# Patient Record
Sex: Female | Born: 1965 | Hispanic: Yes | Marital: Single | Smoking: Never smoker
Health system: Southern US, Community
[De-identification: ages and names within clinical notes are randomized; demographics above are authoritative.]

## PROBLEM LIST (undated history)

## (undated) DIAGNOSIS — E119 Type 2 diabetes mellitus without complications: Secondary | ICD-10-CM

## (undated) DIAGNOSIS — R51 Headache: Secondary | ICD-10-CM

## (undated) DIAGNOSIS — R519 Headache, unspecified: Secondary | ICD-10-CM

## (undated) HISTORY — PX: CARPAL TUNNEL RELEASE: SHX101

---

## 2018-01-17 ENCOUNTER — Other Ambulatory Visit: Payer: Self-pay

## 2018-01-17 ENCOUNTER — Encounter (HOSPITAL_BASED_OUTPATIENT_CLINIC_OR_DEPARTMENT_OTHER): Payer: Self-pay | Admitting: Emergency Medicine

## 2018-01-17 ENCOUNTER — Emergency Department (HOSPITAL_BASED_OUTPATIENT_CLINIC_OR_DEPARTMENT_OTHER)
Admission: EM | Admit: 2018-01-17 | Discharge: 2018-01-18 | Disposition: A | Discharge status: Home / Self Care | Payer: BLUE CROSS/BLUE SHIELD | Attending: Emergency Medicine | Admitting: Emergency Medicine

## 2018-01-17 DIAGNOSIS — R42 Dizziness and giddiness: Secondary | ICD-10-CM | POA: Insufficient documentation

## 2018-01-17 DIAGNOSIS — R51 Headache: Secondary | ICD-10-CM | POA: Diagnosis present

## 2018-01-17 DIAGNOSIS — R358 Other polyuria: Secondary | ICD-10-CM | POA: Diagnosis not present

## 2018-01-17 DIAGNOSIS — E119 Type 2 diabetes mellitus without complications: Secondary | ICD-10-CM | POA: Diagnosis not present

## 2018-01-17 DIAGNOSIS — R631 Polydipsia: Secondary | ICD-10-CM | POA: Insufficient documentation

## 2018-01-17 DIAGNOSIS — R110 Nausea: Secondary | ICD-10-CM | POA: Insufficient documentation

## 2018-01-17 DIAGNOSIS — H53149 Visual discomfort, unspecified: Secondary | ICD-10-CM | POA: Insufficient documentation

## 2018-01-17 DIAGNOSIS — R739 Hyperglycemia, unspecified: Secondary | ICD-10-CM | POA: Insufficient documentation

## 2018-01-17 DIAGNOSIS — G43009 Migraine without aura, not intractable, without status migrainosus: Secondary | ICD-10-CM | POA: Insufficient documentation

## 2018-01-17 HISTORY — DX: Type 2 diabetes mellitus without complications: E11.9

## 2018-01-17 HISTORY — DX: Headache: R51

## 2018-01-17 HISTORY — DX: Headache, unspecified: R51.9

## 2018-01-17 LAB — URINALYSIS, ROUTINE W REFLEX MICROSCOPIC
BILIRUBIN URINE: NEGATIVE
Glucose, UA: >=500 mg/dL — AB
HGB URINE DIPSTICK: NEGATIVE
Ketones, ur: NEGATIVE mg/dL
Leukocytes, UA: NEGATIVE
Nitrite: NEGATIVE
PH: 7 (ref 5.0–8.0)
Protein, ur: NEGATIVE mg/dL
SPECIFIC GRAVITY, URINE: 1.01 (ref 1.005–1.030)

## 2018-01-17 LAB — CBC
HEMATOCRIT: 44.6 % (ref 36.0–46.0)
HEMOGLOBIN: 15.4 g/dL — AB (ref 12.0–15.0)
MCH: 30 pg (ref 26.0–34.0)
MCHC: 34.5 g/dL (ref 30.0–36.0)
MCV: 86.8 fL (ref 78.0–100.0)
Platelets: 353 10*3/uL (ref 150–400)
RBC: 5.14 MIL/uL — AB (ref 3.87–5.11)
RDW: 12.5 % (ref 11.5–15.5)
WBC: 12 10*3/uL — ABNORMAL HIGH (ref 4.0–10.5)

## 2018-01-17 LAB — URINALYSIS, MICROSCOPIC (REFLEX)

## 2018-01-17 LAB — BASIC METABOLIC PANEL
ANION GAP: 11 (ref 5–15)
BUN: 9 mg/dL (ref 6–20)
CHLORIDE: 98 mmol/L — AB (ref 101–111)
CO2: 23 mmol/L (ref 22–32)
Calcium: 8.9 mg/dL (ref 8.9–10.3)
Creatinine, Ser: 0.59 mg/dL (ref 0.44–1.00)
GFR calc non Af Amer: >60 mL/min (ref >60)
GLUCOSE: 456 mg/dL — AB (ref 65–99)
POTASSIUM: 3.7 mmol/L (ref 3.5–5.1)
Sodium: 132 mmol/L — ABNORMAL LOW (ref 135–145)

## 2018-01-17 LAB — CBG MONITORING, ED
GLUCOSE-CAPILLARY: 390 mg/dL — AB (ref 65–99)
Glucose-Capillary: 445 mg/dL — ABNORMAL HIGH (ref 65–99)

## 2018-01-17 LAB — PREGNANCY, URINE: Preg Test, Ur: NEGATIVE

## 2018-01-17 MED ORDER — KETOROLAC TROMETHAMINE 30 MG/ML IJ SOLN
30.0000 mg | Freq: Once | INTRAMUSCULAR | Status: AC
  Administered 2018-01-17: 30 mg via INTRAVENOUS
  Filled 2018-01-17: qty 1

## 2018-01-17 MED ORDER — SODIUM CHLORIDE 0.9 % IV BOLUS (SEPSIS)
1000.0000 mL | Freq: Once | INTRAVENOUS | Status: AC
  Administered 2018-01-18: 1000 mL via INTRAVENOUS

## 2018-01-17 MED ORDER — SODIUM CHLORIDE 0.9 % IV BOLUS (SEPSIS)
1000.0000 mL | Freq: Once | INTRAVENOUS | Status: AC
  Administered 2018-01-17: 1000 mL via INTRAVENOUS

## 2018-01-17 MED ORDER — METOCLOPRAMIDE HCL 5 MG/ML IJ SOLN
10.0000 mg | Freq: Once | INTRAMUSCULAR | Status: AC
  Administered 2018-01-17: 10 mg via INTRAVENOUS
  Filled 2018-01-17: qty 2

## 2018-01-17 NOTE — ED Provider Notes (Signed)
[facility] EMERGENCY DEPARTMENT Provider Note   CSN: [id number] Arrival date & time: 01/17/18  1931     History   Chief Complaint Chief Complaint  Patient presents with  . Headache    HPI Debbie Barrett is a 52 y.o. female with past medical history of diabetes, has not taking insulin for several months due to finances, migraines, who presents to ED for evaluation of 3-hour history of gradually worsening headache.  She reports associated nausea, photophobia.  Reports this feels similar to her prior migraines.  She had not had a migraine in about 3 months.  She took ibuprofen no relief in her symptoms.  She also reports intermittent dizziness as well as polyuria and polydipsia.  Denies any fevers, neck pain, numbness in arms or legs, head injuries or falls, vision changes, URI symptoms.  HPI  Past Medical History:  Diagnosis Date  . Diabetes mellitus without complication (HCC)   . Headache     There are no active problems to display for this patient.   History reviewed. No pertinent surgical history.  OB History    No data available       Home Medications    Prior to Admission medications   Not on File    Family History History reviewed. No pertinent family history.  Social History Social History   Tobacco Use  . Smoking status: Never Smoker  . Smokeless tobacco: Never Used  Substance Use Topics  . Alcohol use: No    Frequency: Never  . Drug use: No     Allergies   Ketorolac; Penicillins; Sulfa antibiotics; and Tramadol   Review of Systems Review of Systems  Constitutional: Negative for appetite change, chills and fever.  HENT: Negative for ear pain, rhinorrhea, sneezing and sore throat.   Eyes: Positive for photophobia. Negative for visual disturbance.  Respiratory: Negative for cough, chest tightness, shortness of breath and wheezing.   Cardiovascular: Negative for chest pain and palpitations.  Gastrointestinal: Positive for nausea.  Negative for abdominal pain, blood in stool, constipation, diarrhea and vomiting.  Endocrine: Positive for polydipsia and polyuria.  Genitourinary: Negative for dysuria, hematuria and urgency.  Musculoskeletal: Negative for myalgias.  Skin: Negative for rash.  Neurological: Positive for dizziness and headaches. Negative for weakness and light-headedness.     Physical Exam Updated Vital Signs BP (!) 122/92 (BP Location: Right Arm)   Pulse (!) 115   Temp 98.4 F (36.9 C) (Oral)   Resp 18   Ht 5\' 4"  (1.626 m)   Wt 99.8 kg (220 lb)   SpO2 98%   BMI 37.76 kg/m   Physical Exam  Constitutional: She is oriented to person, place, and time. She appears well-developed and well-nourished. No distress.  HENT:  Head: Normocephalic and atraumatic.  Nose: Nose normal.  Eyes: Conjunctivae and EOM are normal. Left eye exhibits no discharge. No scleral icterus.  Neck: Normal range of motion. Neck supple.  Cardiovascular: Normal rate, regular rhythm, normal heart sounds and intact distal pulses. Exam reveals no gallop and no friction rub.  No murmur heard. Pulmonary/Chest: Effort normal and breath sounds normal. No respiratory distress.  Abdominal: Soft. Bowel sounds are normal. She exhibits no distension. There is no tenderness. There is no guarding.  Musculoskeletal: Normal range of motion. She exhibits no edema.  Neurological: She is alert and oriented to person, place, and time. No cranial nerve deficit or sensory deficit. She exhibits normal muscle tone. Coordination normal.  Pupils reactive. No facial asymmetry noted. Cranial  nerves appear grossly intact. Sensation intact to light touch on face, BUE and BLE. Strength 5/5 in BUE and BLE.   Skin: Skin is warm and dry. No rash noted.  Psychiatric: She has a normal mood and affect.  Nursing note and vitals reviewed.    ED Treatments / Results  Labs (all labs ordered are listed, but only abnormal results are displayed) Labs Reviewed  BASIC  METABOLIC PANEL - Abnormal; Notable for the following components:      Result Value   Sodium 132 (*)    Chloride 98 (*)    Glucose, Bld 456 (*)    All other components within normal limits  CBC - Abnormal; Notable for the following components:   WBC 12.0 (*)    RBC 5.14 (*)    Hemoglobin 15.4 (*)    All other components within normal limits  URINALYSIS, ROUTINE W REFLEX MICROSCOPIC - Abnormal; Notable for the following components:   Glucose, UA >=500 (*)    All other components within normal limits  URINALYSIS, MICROSCOPIC (REFLEX) - Abnormal; Notable for the following components:   Bacteria, UA RARE (*)    Squamous Epithelial / LPF 0-5 (*)    All other components within normal limits  CBG MONITORING, ED - Abnormal; Notable for the following components:   Glucose-Capillary 445 (*)    All other components within normal limits  CBG MONITORING, ED - Abnormal; Notable for the following components:   Glucose-Capillary 390 (*)    All other components within normal limits  PREGNANCY, URINE    EKG  EKG Interpretation None       Radiology No results found.  Procedures Procedures (including critical care time)  Medications Ordered in ED Medications  sodium chloride 0.9 % bolus 1,000 mL (0 mLs Intravenous Stopped 01/17/18 2227)  metoCLOPramide (REGLAN) injection 10 mg (10 mg Intravenous Given 01/17/18 2138)  ketorolac (TORADOL) 30 MG/ML injection 30 mg (30 mg Intravenous Given 01/17/18 2229)  sodium chloride 0.9 % bolus 1,000 mL (1,000 mLs Intravenous New Bag/Given 01/17/18 2229)  sodium chloride 0.9 % bolus 1,000 mL (1,000 mLs Intravenous New Bag/Given 01/18/18 0014)     Initial Impression / Assessment and Plan / ED Course  I have reviewed the triage vital signs and the nursing notes.  Pertinent labs & imaging results that were available during my care of the patient were reviewed by me and considered in my medical decision making (see chart for details).  Clinical Course as  of Jan 19 20  Fri Jan 17, 2018  2356 Left a message for case management at [facility] ED to contact patient to establish with PCP at [facility] and any medication assistance.  [HK]  2357 Reports improvement in headache, dizziness. Ambulatory with normal gait.  [HK]    Clinical Course User Index [HK] Dietrich Pates, PA-C    Patient presents to ED for evaluation of 3-hour history of gradually worsening headache with associated nausea and photophobia.  Also reports dizziness.  She is diabetic and has not taking her diabetes medications or insulin in several months.  She does report polyuria and polydipsia.  On physical exam she is overall well-appearing.  She does appear uncomfortable but she has no deficits on her neurological examination.  She does not appear dehydrated.  Initial CBG was 145.  Urinalysis with no evidence of UTI or ketonuria.  BMP slightly low sodium at 132 but otherwise no signs of acidosis.  CBC with mild leukocytosis at 12 which could be reactive.  Hemoglobin hematocrit normal.  CBG improved to 90 with fluids given here.  She reports complete resolution of her symptoms with Toradol, Reglan given here in the ED and is been able to tolerate p.o. intake without difficulty.   There are no headache characteristics that are lateralizing or concerning for increased ICP, infectious or vascular cause of his symptoms.   She is ambulatory with normal gait.  I did contact case management to set up an appointment with a new PCP at the wellness center and for medication assistance.  Heart rate improving with fluids. Will recheck HR after 3rd liter of fluids is completed.  Portions of this note were generated with Scientist, clinical (histocompatibility and immunogenetics)Dragon dictation software. Dictation errors may occur despite best attempts at proofreading.   Final Clinical Impressions(s) / ED Diagnoses   Final diagnoses:  Migraine without aura and without status migrainosus, not intractable  Hyperglycemia    ED Discharge Orders    None        Dietrich PatesKhatri, Rielynn Trulson, PA-C 01/18/18 [id number]    Gwyneth SproutPlunkett, Whitney, MD 01/19/18 2019

## 2018-01-17 NOTE — ED Triage Notes (Addendum)
Patient states that she has had a headache since she ate dinner. Reports that she is dizzy with the pain and nausea  - patient states that she is not taking her insulin because she has not been able to afford it. Patient has not been checking her sugars as well

## 2018-01-18 LAB — CBG MONITORING, ED
GLUCOSE-CAPILLARY: 335 mg/dL — AB (ref 65–99)
Glucose-Capillary: 294 mg/dL — ABNORMAL HIGH (ref 65–99)

## 2018-01-18 MED ORDER — INSULIN GLARGINE 100 UNIT/ML ~~LOC~~ SOLN: 40.0000 [IU] | Freq: Every day | SUBCUTANEOUS | 6 refills | Status: AC

## 2018-01-18 MED ORDER — INSULIN GLARGINE 100 UNIT/ML ~~LOC~~ SOLN
SUBCUTANEOUS | Status: AC
  Filled 2018-01-18: qty 1

## 2018-01-18 MED ORDER — INSULIN GLARGINE 100 UNIT/ML ~~LOC~~ SOLN
40.0000 [IU] | Freq: Once | SUBCUTANEOUS | Status: AC
  Administered 2018-01-18: 40 [IU] via SUBCUTANEOUS

## 2018-01-18 MED ORDER — INSULIN ASPART 100 UNIT/ML ~~LOC~~ SOLN: SUBCUTANEOUS | 6 refills | Status: AC

## 2018-01-18 MED ORDER — INSULIN REGULAR HUMAN 100 UNIT/ML IJ SOLN
10.0000 [IU] | Freq: Once | INTRAMUSCULAR | Status: AC
  Administered 2018-01-18: 10 [IU] via SUBCUTANEOUS
  Filled 2018-01-18: qty 1

## 2018-01-18 NOTE — ED Provider Notes (Signed)
1:00 AM  [facility], [place].  Patient is a 52 year old insulin-dependent diabetic who presented to the emergency department today with headache.  Also found to be tachycardic and hyperglycemic.  Unable to afford her medications.  Headache treated with migraine cocktail and patient is feeling better.  Blood glucose slowly improving with IV hydration as is her heart rate.  Plan is to check EKG, repeat CBG.  Labs and urine showed no sign of DKA.  Patient denies chest pain or shortness of breath.  Headache has improved.  No recent fevers, vomiting or diarrhea.  States she is on NovoLog 3 times a day and Lantus 40 units at night.  Reports she has not had her insulin in months because she cannot afford it.  2:15 AM  Pt's HR is 97.  2:45 AM  Pt's blood sugar now under 300.  Case management has been contacted to help patient with her prescriptions.  I will refill her NovoLog and Lantus.  Recommend alternating Tylenol and Motrin for headache.  Given list of primary care physicians.  Recommended increase fluid intake.  At this time, I do not feel there is any life-threatening condition present. I have reviewed and discussed all results (EKG, imaging, lab, urine as appropriate) and exam findings with patient/family. I have reviewed nursing notes and appropriate previous records.  I feel the patient is safe to be discharged home without further emergent workup and can continue workup as an outpatient as needed. Discussed usual and customary return precautions. Patient/family verbalize understanding and are comfortable with this plan.  Outpatient follow-up has been provided if needed. All questions have been answered.    EKG Interpretation  Date/Time:  Saturday January 18 2018 01:06:53 EDT Ventricular Rate:  106 PR Interval:    QRS Duration: 116 QT Interval:  350 QTC Calculation: 465 R Axis:   90 Text Interpretation:  Sinus tachycardia Right atrial enlargement Nonspecific intraventricular  conduction delay Probable anteroseptal infarct, old No old tracing to compare Confirmed by Kenichi Cassada [id number]) on 01/18/2018 1:10:10 AM         Lamiah Marmol, Layla MawKristen N, DO 01/18/18 [id number]

## 2018-01-18 NOTE — ED Notes (Signed)
Pt verbalizes understanding of d/c instructions and denies any further need at this time. 

## 2018-01-18 NOTE — Discharge Instructions (Addendum)
Please read attached information regarding your condition. Follow-up at the wellness center listed below for further evaluation. The case management will contact you in the next few days to help with medication assistance and arranging PCP follow-up. Continue your home medications as previously prescribed. Return to ED for worsening symptoms, head injuries or falls, numbness in arms or legs, increased vomiting.   To find a primary care or specialty doctor please call [phone] or [phone] to access "[facility] Find a Doctor Service."  You may also go on the [facility] website at [email or web address]  There are also multiple [facility], [facility], [facility] and [facility] practices throughout the [place] that are frequently accepting new patients. You may find a clinic that is close to your home and contact them.  [facility] -  [address] [place] [id number] [phone]   [facility] -  [address] [place] [id number] [phone]   [facility] [phone]  [place] [phone]

## 2018-01-18 NOTE — ED Notes (Signed)
Pt feeling better, ready to go home.

## 2018-12-20 ENCOUNTER — Other Ambulatory Visit: Payer: Self-pay

## 2018-12-20 ENCOUNTER — Emergency Department (HOSPITAL_BASED_OUTPATIENT_CLINIC_OR_DEPARTMENT_OTHER)
Admission: EM | Admit: 2018-12-20 | Discharge: 2018-12-20 | Disposition: A | Discharge status: Home / Self Care | Payer: Medicaid Other | Attending: Emergency Medicine | Admitting: Emergency Medicine

## 2018-12-20 ENCOUNTER — Encounter (HOSPITAL_BASED_OUTPATIENT_CLINIC_OR_DEPARTMENT_OTHER): Payer: Self-pay | Admitting: *Deleted

## 2018-12-20 DIAGNOSIS — K0889 Other specified disorders of teeth and supporting structures: Secondary | ICD-10-CM | POA: Diagnosis not present

## 2018-12-20 DIAGNOSIS — Z794 Long term (current) use of insulin: Secondary | ICD-10-CM | POA: Diagnosis not present

## 2018-12-20 DIAGNOSIS — E119 Type 2 diabetes mellitus without complications: Secondary | ICD-10-CM | POA: Insufficient documentation

## 2018-12-20 DIAGNOSIS — R6884 Jaw pain: Secondary | ICD-10-CM | POA: Diagnosis present

## 2018-12-20 MED ORDER — CLINDAMYCIN HCL 150 MG PO CAPS: 300.0000 mg | ORAL_CAPSULE | Freq: Four times a day (QID) | ORAL | 0 refills | Status: AC

## 2018-12-20 MED ORDER — CLINDAMYCIN HCL 150 MG PO CAPS
300.0000 mg | ORAL_CAPSULE | Freq: Once | ORAL | Status: AC
  Administered 2018-12-20: 300 mg via ORAL
  Filled 2018-12-20: qty 2

## 2018-12-20 NOTE — ED Provider Notes (Signed)
[facility] EMERGENCY DEPARTMENT Provider Note   CSN: [id number] Arrival date & time: 12/20/18  1813     History   Chief Complaint Chief Complaint  Patient presents with  . Dental Pain    HPI Debbie Barrett is a 53 y.o. female.  Patient with history of diabetes presents the emergency department with worsening pain in her left lower jaw for the past 2 days.  She states that pain is severe and is no longer her to sleep.  She has some subjective facial swelling.  No neck pain, trouble breathing or trouble swallowing.  She has a tooth that is loose in the back of her mouth that is causing her pain.  She states that she is taking Tylenol without relief of symptoms.     Past Medical History:  Diagnosis Date  . Diabetes mellitus without complication (HCC)   . Headache     There are no active problems to display for this patient.   Past Surgical History:  Procedure Laterality Date  . CARPAL TUNNEL RELEASE    . CESAREAN SECTION       OB History   No obstetric history on file.      Home Medications    Prior to Admission medications   Medication Sig Start Date End Date Taking? Authorizing Provider  clindamycin (CLEOCIN) 150 MG capsule Take 2 capsules (300 mg total) by mouth every 6 (six) hours. 12/20/18   Renne Crigler, PA-C  insulin aspart (NOVOLOG) 100 UNIT/ML injection Sliding scale insulin 3 times a day with meals 01/18/18   Ward, Baxter Hire N, DO  insulin glargine (LANTUS) 100 UNIT/ML injection Inject 0.4 mLs (40 Units total) into the skin at bedtime. 01/18/18   Ward, Layla Maw, DO    Family History No family history on file.  Social History Social History   Tobacco Use  . Smoking status: Never Smoker  . Smokeless tobacco: Never Used  Substance Use Topics  . Alcohol use: Yes    Frequency: Never    Comment: occasional  . Drug use: No     Allergies   Ketorolac; Penicillins; Sulfa antibiotics; and Tramadol   Review of Systems Review of Systems   Constitutional: Negative for fever.  HENT: Positive for dental problem and facial swelling. Negative for ear pain, sore throat and trouble swallowing.   Respiratory: Negative for shortness of breath and stridor.   Musculoskeletal: Negative for neck pain.  Skin: Negative for color change.  Neurological: Negative for headaches.     Physical Exam Updated Vital Signs BP 127/87 (BP Location: Right Arm)   Pulse (!) 105   Temp 98.1 F (36.7 C) (Oral)   Resp 18   Ht 5\' 4"  (1.626 m)   Wt 102.1 kg   LMP 12/13/2018 (Approximate)   SpO2 100%   BMI 38.62 kg/m   Physical Exam Vitals signs and nursing note reviewed.  Constitutional:      Appearance: She is well-developed.  HENT:     Head: Normocephalic and atraumatic.     Jaw: No trismus.     Right Ear: Tympanic membrane, ear canal and external ear normal.     Left Ear: Tympanic membrane, ear canal and external ear normal.     Nose: Nose normal.     Mouth/Throat:     Dentition: Abnormal dentition. Dental caries present. No dental abscesses.     Pharynx: Uvula midline. No uvula swelling.     Tonsils: No tonsillar abscesses.     Comments: Advanced  periodontal disease, with multiple missing teeth and multiple caries.  Patient with a loose left mandibular molar.  Gingiva is extremely inflamed without signs of distinct abscess. Eyes:     Conjunctiva/sclera: Conjunctivae normal.  Neck:     Musculoskeletal: Normal range of motion and neck supple.     Comments: No neck swelling or Ludwig's angina Lymphadenopathy:     Cervical: No cervical adenopathy.  Skin:    General: Skin is warm and dry.  Neurological:     Mental Status: She is alert.      ED Treatments / Results  Labs (all labs ordered are listed, but only abnormal results are displayed) Labs Reviewed - No data to display  EKG None  Radiology No results found.  Procedures Procedures (including critical care time)  Medications Ordered in ED Medications  clindamycin  (CLEOCIN) capsule 300 mg (has no administration in time range)     Initial Impression / Assessment and Plan / ED Course  I have reviewed the triage vital signs and the nursing notes.  Pertinent labs & imaging results that were available during my care of the patient were reviewed by me and considered in my medical decision making (see chart for details).     7:02 PM Patient seen and examined. Medications ordered.   Vital signs reviewed and are as follows: BP 127/87 (BP Location: Right Arm)   Pulse (!) 105   Temp 98.1 F (36.7 C) (Oral)   Resp 18   Ht 5\' 4"  (1.626 m)   Wt 102.1 kg   LMP 12/13/2018 (Approximate)   SpO2 100%   BMI 38.62 kg/m   Encouraged continued use of Tylenol for pain.  Patient counseled to take prescribed medications as directed, return with worsening facial or neck swelling, and to follow-up with their dentist as soon as possible.    Final Clinical Impressions(s) / ED Diagnoses   Final diagnoses:  Pain, dental   Patient with toothache. No fever. Exam unconcerning for Ludwig's angina or other deep tissue infection in neck.   As there is gum swelling, erythema, will treat with antibiotic.  Dental referrals given.  Urged patient to follow-up with dentist.     ED Discharge Orders         Ordered    clindamycin (CLEOCIN) 150 MG capsule  Every 6 hours     12/20/18 1858           Renne Crigler, Cordelia Poche 12/20/18 1903    Pricilla Loveless, MD 12/22/18 [phone]

## 2018-12-20 NOTE — ED Triage Notes (Signed)
Pt reports she has "bad teeth" and is having lower left dental pain since yesterday. States she cannot afford a dentist currently

## 2018-12-20 NOTE — Discharge Instructions (Signed)
Please read and follow all provided instructions.  Your diagnoses today include:  1. Pain, dental     The exam and treatment you received today has been provided on an emergency basis only. This is not a substitute for complete medical or dental care.  Tests performed today include:  Vital signs. See below for your results today.   Medications prescribed:   Clindamycin - antibiotic  You have been prescribed an antibiotic medicine: take the entire course of medicine even if you are feeling better. Stopping early can cause the antibiotic not to work.  Take any prescribed medications only as directed.  Home care instructions:  Follow any educational materials contained in this packet.  Follow-up instructions: Please follow-up with your dentist for further evaluation of your symptoms.   Return instructions:   Please return to the Emergency Department if you experience worsening symptoms.  Please return if you develop a fever, you develop more swelling in your face or neck, you have trouble breathing or swallowing food.  Please return if you have any other emergent concerns.  Additional Information:  Your vital signs today were: BP 127/87 (BP Location: Right Arm)    Pulse (!) 105    Temp 98.1 F (36.7 C) (Oral)    Resp 18    Ht 5\' 4"  (1.626 m)    Wt 102.1 kg    LMP 12/13/2018 (Approximate)    SpO2 100%    BMI 38.62 kg/m  If your blood pressure (BP) was elevated above 135/85 this visit, please have this repeated by your doctor within one month. --------------

## 2019-06-26 ENCOUNTER — Emergency Department (HOSPITAL_BASED_OUTPATIENT_CLINIC_OR_DEPARTMENT_OTHER): Payer: Medicaid Other

## 2019-06-26 ENCOUNTER — Encounter (HOSPITAL_BASED_OUTPATIENT_CLINIC_OR_DEPARTMENT_OTHER): Payer: Self-pay | Admitting: *Deleted

## 2019-06-26 ENCOUNTER — Other Ambulatory Visit: Payer: Self-pay

## 2019-06-26 ENCOUNTER — Emergency Department (HOSPITAL_BASED_OUTPATIENT_CLINIC_OR_DEPARTMENT_OTHER)
Admission: EM | Admit: 2019-06-26 | Discharge: 2019-06-26 | Disposition: A | Discharge status: Home / Self Care | Payer: Medicaid Other | Attending: Emergency Medicine | Admitting: Emergency Medicine

## 2019-06-26 DIAGNOSIS — R000 Tachycardia, unspecified: Secondary | ICD-10-CM | POA: Insufficient documentation

## 2019-06-26 DIAGNOSIS — Z794 Long term (current) use of insulin: Secondary | ICD-10-CM | POA: Diagnosis not present

## 2019-06-26 DIAGNOSIS — E119 Type 2 diabetes mellitus without complications: Secondary | ICD-10-CM | POA: Diagnosis not present

## 2019-06-26 DIAGNOSIS — H4920 Sixth [abducent] nerve palsy, unspecified eye: Secondary | ICD-10-CM | POA: Diagnosis not present

## 2019-06-26 DIAGNOSIS — R51 Headache: Secondary | ICD-10-CM | POA: Diagnosis present

## 2019-06-26 LAB — COMPREHENSIVE METABOLIC PANEL
ALT: 16 U/L (ref 0–44)
AST: 14 U/L — ABNORMAL LOW (ref 15–41)
Albumin: 3.8 g/dL (ref 3.5–5.0)
Alkaline Phosphatase: 56 U/L (ref 38–126)
Anion gap: 9 (ref 5–15)
BUN: 13 mg/dL (ref 6–20)
CO2: 26 mmol/L (ref 22–32)
Calcium: 9.2 mg/dL (ref 8.9–10.3)
Chloride: 103 mmol/L (ref 98–111)
Creatinine, Ser: 0.65 mg/dL (ref 0.44–1.00)
GFR calc Af Amer: >60 mL/min (ref >60)
GFR calc non Af Amer: >60 mL/min (ref >60)
Glucose, Bld: 136 mg/dL — ABNORMAL HIGH (ref 70–99)
Potassium: 3.6 mmol/L (ref 3.5–5.1)
Sodium: 138 mmol/L (ref 135–145)
Total Bilirubin: 0.6 mg/dL (ref 0.3–1.2)
Total Protein: 7.5 g/dL (ref 6.5–8.1)

## 2019-06-26 LAB — CBC WITH DIFFERENTIAL/PLATELET
Abs Immature Granulocytes: 0.05 10*3/uL (ref 0.00–0.07)
Basophils Absolute: 0 10*3/uL (ref 0.0–0.1)
Basophils Relative: 0 %
Eosinophils Absolute: 0.2 10*3/uL (ref 0.0–0.5)
Eosinophils Relative: 2 %
HCT: 42.9 % (ref 36.0–46.0)
Hemoglobin: 14.2 g/dL (ref 12.0–15.0)
Immature Granulocytes: 1 %
Lymphocytes Relative: 34 %
Lymphs Abs: 3.4 10*3/uL (ref 0.7–4.0)
MCH: 30.1 pg (ref 26.0–34.0)
MCHC: 33.1 g/dL (ref 30.0–36.0)
MCV: 91.1 fL (ref 80.0–100.0)
Monocytes Absolute: 0.5 10*3/uL (ref 0.1–1.0)
Monocytes Relative: 5 %
Neutro Abs: 6 10*3/uL (ref 1.7–7.7)
Neutrophils Relative %: 58 %
Platelets: 373 10*3/uL (ref 150–400)
RBC: 4.71 MIL/uL (ref 3.87–5.11)
RDW: 11.8 % (ref 11.5–15.5)
WBC: 10.2 10*3/uL (ref 4.0–10.5)
nRBC: 0 % (ref 0.0–0.2)

## 2019-06-26 LAB — URINALYSIS, ROUTINE W REFLEX MICROSCOPIC
Bilirubin Urine: NEGATIVE
Glucose, UA: 250 mg/dL — AB
Hgb urine dipstick: NEGATIVE
Ketones, ur: NEGATIVE mg/dL
Nitrite: NEGATIVE
Protein, ur: NEGATIVE mg/dL
Specific Gravity, Urine: 1.025 (ref 1.005–1.030)
pH: 6 (ref 5.0–8.0)

## 2019-06-26 LAB — SEDIMENTATION RATE: Sed Rate: 42 mm/hr — ABNORMAL HIGH (ref 0–22)

## 2019-06-26 LAB — URINALYSIS, MICROSCOPIC (REFLEX): RBC / HPF: NONE SEEN RBC/hpf (ref 0–5)

## 2019-06-26 LAB — C-REACTIVE PROTEIN: CRP: <0.8 mg/dL (ref <1.0)

## 2019-06-26 IMAGING — CT CT ANGIOGRAPHY HEAD
1 of 17 series · 4 of 35 positions shown · IV contrast (APPLIED)
Comparison: Brain MRI [DATE]

CLINICAL DATA: L vision for 1 month

EXAM:
CT ANGIOGRAPHY HEAD AND NECK
CT venogram
TECHNIQUE: Multidetector CT imaging of the head and neck was performed using
the standard protocol during bolus administration of intravenous
contrast. Multiplanar CT image reconstructions and MIPs were
obtained to evaluate the vascular anatomy. Carotid stenosis
measurements (when applicable) are obtained utilizing NASCET
criteria, using the distal internal carotid diameter as the
denominator.
A CT venogram was requested and a delayed phase was acquired, but
was not needed given even better venous sided opacification on the
CTA portion of the study.
CONTRAST:  100mL OMNIPAQUE IOHEXOL 350 MG/ML SOLN

[Series 11: axial thin · axial · 0.58mm/px · z∈[-348,-113]mm · 4 of 395 slices shown]
[im 79/395  soft-tissue]
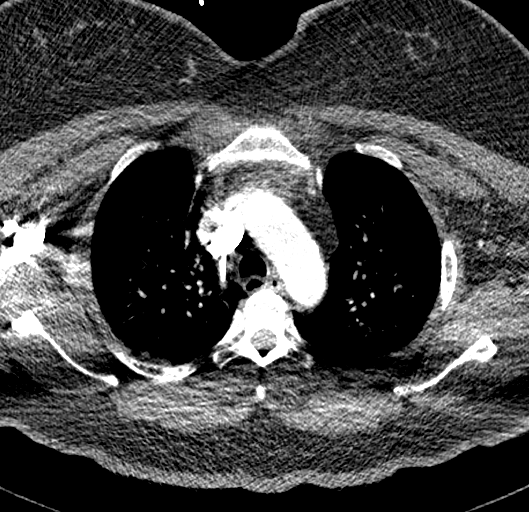
[im 158/395  bone]
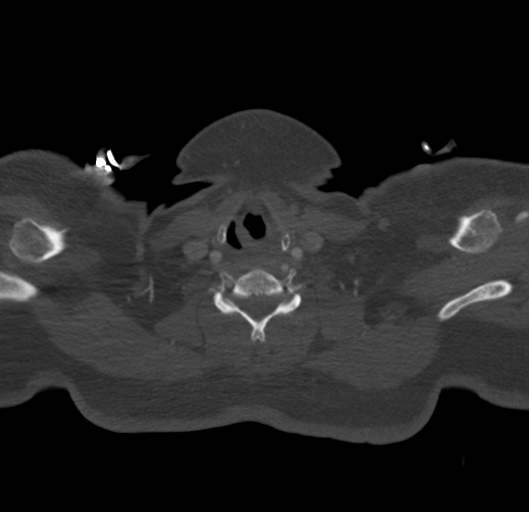
[im 237/395  soft-tissue]
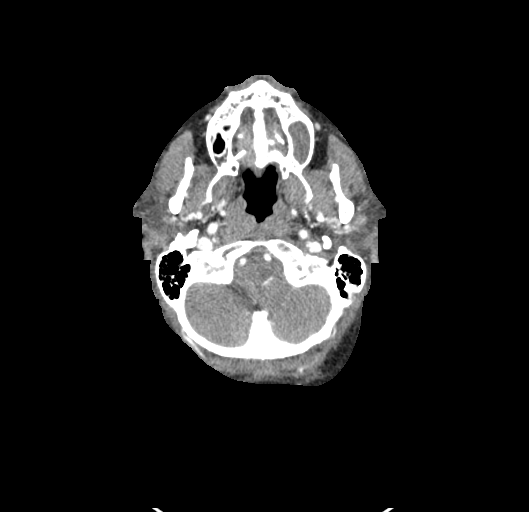
[im 316/395  bone]
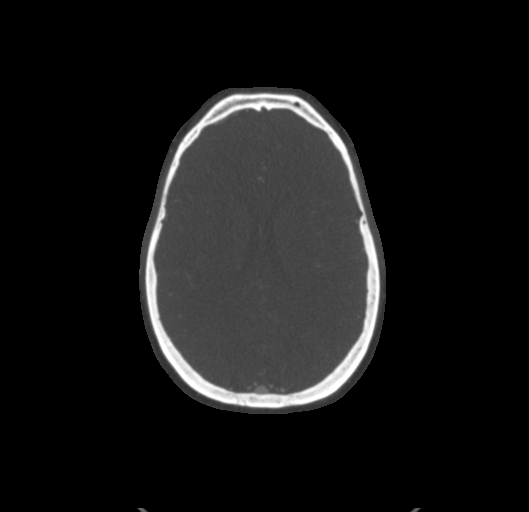

[4 of 35 positions shown; findings below may reference images not displayed]

FINDINGS: CT HEAD FINDINGS

Brain: No evidence of acute infarction, hemorrhage, hydrocephalus,
extra-axial collection or mass lesion/mass effect.

Vascular: Negative

Skull: Negative

Sinuses: Retention cyst in the left maxillary sinus

Orbits: Negative.  No mass or inflammation to explain symptoms.

Review of the MIP images confirms the above findings

CTA NECK FINDINGS

Aortic arch: Normal 4 vessel branching. No acute finding or
dilatation.

Right carotid system: Vessels are smooth and widely patent

Left carotid system: The vessels are smooth and widely patent

Vertebral arteries: Left vertebral artery arises from the arch. No
proximal subclavian stenosis. Vessels are smooth and widely patent
to the dura.

Skeleton: No acute or aggressive finding

Other neck: 6 mm right upper lobe pulmonary nodule.

Upper chest: Negative

Review of the MIP images confirms the above findings

CTA HEAD FINDINGS

Anterior circulation: Vessels are smooth and widely patent. No
atheromatous changes or stenosis. Negative for aneurysm

Posterior circulation: Vessels are smooth and widely patent. No
stenosis, aneurysm, or branch occlusion.

Venous sinuses: The dural venous sinuses are diffusely patent
without stenosis an arachnoid granulation is present along the right
transverse sinus. Paired deep veins are also patent.

Anatomic variants: None significant

Elevated right diaphragm on scanogram.

Delayed phase was acquired for CT venogram purposes. As above, these
are less helpful than the CTA images and are also negative for dural
venous sinus thrombosis. No evidence of abnormal parenchymal
enhancement.

Review of the MIP images confirms the above findings
IMPRESSION: 1. Negative CTA of the head and neck.
2. No dural venous sinus thrombosis.
3. 5 mm right upper lobe pulmonary nodule. No follow-up needed if
patient is low-risk. Non-contrast chest CT can be considered in 12
months if patient is high-risk. This recommendation follows the
consensus statement: Guidelines for Management of Incidental
Pulmonary Nodules Detected on CT Images: From the [HOSPITAL]

## 2019-06-26 MED ORDER — SODIUM CHLORIDE 0.9 % IV BOLUS
1000.0000 mL | Freq: Once | INTRAVENOUS | Status: AC
  Administered 2019-06-26: 1000 mL via INTRAVENOUS

## 2019-06-26 MED ORDER — PROCHLORPERAZINE EDISYLATE 10 MG/2ML IJ SOLN
10.0000 mg | Freq: Once | INTRAMUSCULAR | Status: AC
  Administered 2019-06-26: 10 mg via INTRAVENOUS
  Filled 2019-06-26: qty 2

## 2019-06-26 MED ORDER — DIPHENHYDRAMINE HCL 50 MG/ML IJ SOLN
25.0000 mg | Freq: Once | INTRAMUSCULAR | Status: AC
  Administered 2019-06-26: 25 mg via INTRAVENOUS
  Filled 2019-06-26: qty 1

## 2019-06-26 MED ORDER — IOHEXOL 350 MG/ML SOLN
100.0000 mL | Freq: Once | INTRAVENOUS | Status: AC | PRN
  Administered 2019-06-26: 100 mL via INTRAVENOUS

## 2019-06-26 NOTE — ED Notes (Signed)
CT will need to wait on results of BUN/Creat/egfr per [facility] radiology protocol d/t pt hx DM

## 2019-06-26 NOTE — ED Provider Notes (Signed)
Care was taken over from Dr. Billy Fischer.  Patient reports double vision and headaches for about 4 weeks.  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  Dr. Feliberto Harts had discussed this with neurology on-call who recommended doing a CT angiogram and a CT venogram.  The studies were normal other than she did have a 5 mm lung nodule.  I did advise the patient of this and advised her to let her PCP know about it although she does not seem to have any risk factors that would be concerning.  She currently is asymptomatic.  Her sed rate is mildly elevated but she does not have any palpable tenderness over the temporal artery or any loss of vision or blurry vision.  The double vision seems to be related to the 6th nerve palsy.  She has had some mild tachycardia which has improved in the ED.  Her heart rate is around 100 currently.  Her other labs are non-concerning.  She is currently completely asymptomatic other than the double vision.  She was discharged home in good condition.  She will follow-up on Monday with the neurologist that she already has an appointment with.  She was given an eye patch.  Return precautions were given.  Her urine did show some bacteria but she does not have symptoms that would be more concerning for UTI.  Her urine was sent for culture.   Malvin Johns, MD 06/26/19 [phone]

## 2019-06-26 NOTE — ED Notes (Signed)
ED Provider at bedside discussing CT results 

## 2019-06-26 NOTE — ED Provider Notes (Signed)
[facility] EMERGENCY DEPARTMENT Provider Note   CSN: [id number] Arrival date & time: 06/26/19  1305     History   Chief Complaint Chief Complaint  Patient presents with   Headache   Eye Problem    HPI Debbie Barrett is a 53 y.o. female.     HPI   Presents with worsening double vision, is present when looking straight ahead with both eyes, looking to the right is ok, looking to the left is double. Goes away when closing one eye. Diagonally up and down.  Headaches started around the same time, has always had migraine, headaches behind eyes and back of head.  Heating pads, has tramadol but doesn't like taking it.  Currently having 9/10 frontal headache  Nausea when headaches are severe. No vomiting. Low appetite.  No fevers.  Glucose has been ok for last 2 weeks until this AM  Sees ophthalmology Dr. Senaida Oresichardson retinal specialist  Progressive worsening of double vision and the headaches over the last month  Scheduled to see Neurologist on Monday Right eye sometimes feels droopy, feet bilateral numbness tingling on gabapentin Right eye has been more droopy in the last week. Comes and goes, not associated with anything, does not happen certain times of day  No numbness/weakness/facial droop/difficulty walking or talking  Started on a Monday about 4 weeks  Had MRI for headache 8/4   No medication changes  Past Medical History:  Diagnosis Date   Diabetes mellitus without complication (HCC)    Headache     There are no active problems to display for this patient.   Past Surgical History:  Procedure Laterality Date   CARPAL TUNNEL RELEASE     CESAREAN SECTION       OB History   No obstetric history on file.      Home Medications    Prior to Admission medications   Medication Sig Start Date End Date Taking? Authorizing Provider  clindamycin (CLEOCIN) 150 MG capsule Take 2 capsules (300 mg total) by mouth every 6 (six) hours. 12/20/18    Renne CriglerGeiple, Joshua, PA-C  insulin aspart (NOVOLOG) 100 UNIT/ML injection Sliding scale insulin 3 times a day with meals 01/18/18   Ward, Baxter HireKristen N, DO  insulin glargine (LANTUS) 100 UNIT/ML injection Inject 0.4 mLs (40 Units total) into the skin at bedtime. 01/18/18   Ward, Layla MawKristen N, DO    Family History No family history on file.  Social History Social History   Tobacco Use   Smoking status: Never Smoker   Smokeless tobacco: Never Used  Substance Use Topics   Alcohol use: Yes    Frequency: Never    Comment: occasional   Drug use: No     Allergies   Ketorolac, Penicillins, Sulfa antibiotics, and Tramadol   Review of Systems Review of Systems  Constitutional: Negative for fever.  HENT: Negative for sore throat.   Eyes: Positive for visual disturbance.  Respiratory: Negative for cough and shortness of breath.   Cardiovascular: Negative for chest pain.  Gastrointestinal: Negative for abdominal pain, nausea and vomiting.  Genitourinary: Negative for difficulty urinating.  Musculoskeletal: Negative for back pain and neck pain.  Skin: Negative for rash.  Neurological: Positive for headaches. Negative for dizziness, syncope, facial asymmetry, speech difficulty, weakness and numbness.     Physical Exam Updated Vital Signs BP 121/69 (BP Location: Left Arm)    Pulse (!) 101    Temp 98.6 F (37 C) (Oral)    Resp 16    Ht  5\' 4"  (1.626 m)    Wt 109.8 kg    SpO2 99%    BMI 41.54 kg/m   Physical Exam Vitals signs and nursing note reviewed.  Constitutional:      General: She is not in acute distress.    Appearance: She is well-developed. She is not diaphoretic.  HENT:     Head: Normocephalic and atraumatic.  Eyes:     General: No visual field deficit.    Conjunctiva/sclera: Conjunctivae normal.  Neck:     Musculoskeletal: Normal range of motion.  Cardiovascular:     Rate and Rhythm: Normal rate and regular rhythm.  Pulmonary:     Effort: Pulmonary effort is normal. No  respiratory distress.  Abdominal:     General: There is no distension.     Palpations: Abdomen is soft.     Tenderness: There is no abdominal tenderness. There is no guarding.  Musculoskeletal:        General: No tenderness.  Skin:    General: Skin is warm and dry.     Findings: No erythema or rash.  Neurological:     Mental Status: She is alert and oriented to person, place, and time.     GCS: GCS eye subscore is 4. GCS verbal subscore is 5. GCS motor subscore is 6.     Cranial Nerves: No cranial nerve deficit, dysarthria or facial asymmetry.     Sensory: Sensation is intact. No sensory deficit.     Motor: Motor function is intact. No weakness.     Coordination: Coordination is intact. Coordination normal. Finger-Nose-Finger Test and [facility] normal.     Gait: Gait is intact.     Comments: Left eye with CN 6 palsy Dysconjugate gaze with left eye turned medially Normal pupils      ED Treatments / Results  Labs (all labs ordered are listed, but only abnormal results are displayed) Labs Reviewed  COMPREHENSIVE METABOLIC PANEL - Abnormal; Notable for the following components:      Result Value   Glucose, Bld 136 (*)    AST 14 (*)    All other components within normal limits  URINALYSIS, ROUTINE W REFLEX MICROSCOPIC - Abnormal; Notable for the following components:   APPearance CLOUDY (*)    Glucose, UA 250 (*)    Leukocytes,Ua TRACE (*)    All other components within normal limits  SEDIMENTATION RATE - Abnormal; Notable for the following components:   Sed Rate 42 (*)    All other components within normal limits  URINALYSIS, MICROSCOPIC (REFLEX) - Abnormal; Notable for the following components:   Bacteria, UA MANY (*)    All other components within normal limits  URINE CULTURE  CBC WITH DIFFERENTIAL/PLATELET  C-REACTIVE PROTEIN    EKG EKG Interpretation  Date/Time:  Friday June 26 2019 15:19:47 EDT Ventricular Rate:  103 PR Interval:    QRS  Duration: 93 QT Interval:  354 QTC Calculation: 464 R Axis:   49 Text Interpretation:  Sinus tachycardia Probable anteroseptal infarct, old since last tracing no significant change Confirmed by Rolan BuccoBelfi, Melanie [phone]([id number]) on 06/26/2019 4:24:37 PM   Radiology Ct Angio Head W Or Wo Contrast  Result Date: 06/26/2019 CLINICAL DATA:  L vision for 1 month EXAM: CT ANGIOGRAPHY HEAD AND NECK CT venogram TECHNIQUE: Multidetector CT imaging of the head and neck was performed using the standard protocol during bolus administration of intravenous contrast. Multiplanar CT image reconstructions and MIPs were obtained to evaluate the vascular anatomy.  Carotid stenosis measurements (when applicable) are obtained utilizing NASCET criteria, using the distal internal carotid diameter as the denominator. A CT venogram was requested and a delayed phase was acquired, but was not needed given even better venous sided opacification on the CTA portion of the study. CONTRAST:  100mL OMNIPAQUE IOHEXOL 350 MG/ML SOLN COMPARISON:  Brain MRI 06/09/2019 FINDINGS: CT HEAD FINDINGS Brain: No evidence of acute infarction, hemorrhage, hydrocephalus, extra-axial collection or mass lesion/mass effect. Vascular: Negative Skull: Negative Sinuses: Retention cyst in the left maxillary sinus Orbits: Negative.  No mass or inflammation to explain symptoms. Review of the MIP images confirms the above findings CTA NECK FINDINGS Aortic arch: Normal 4 vessel branching. No acute finding or dilatation. Right carotid system: Vessels are smooth and widely patent Left carotid system: The vessels are smooth and widely patent Vertebral arteries: Left vertebral artery arises from the arch. No proximal subclavian stenosis. Vessels are smooth and widely patent to the dura. Skeleton: No acute or aggressive finding Other neck: 6 mm right upper lobe pulmonary nodule. Upper chest: Negative Review of the MIP images confirms the above findings CTA HEAD FINDINGS Anterior  circulation: Vessels are smooth and widely patent. No atheromatous changes or stenosis. Negative for aneurysm Posterior circulation: Vessels are smooth and widely patent. No stenosis, aneurysm, or branch occlusion. Venous sinuses: The dural venous sinuses are diffusely patent without stenosis an arachnoid granulation is present along the right transverse sinus. Paired deep veins are also patent. Anatomic variants: None significant Elevated right diaphragm on scanogram. Delayed phase was acquired for CT venogram purposes. As above, these are less helpful than the CTA images and are also negative for dural venous sinus thrombosis. No evidence of abnormal parenchymal enhancement. Review of the MIP images confirms the above findings IMPRESSION: 1. Negative CTA of the head and neck. 2. No dural venous sinus thrombosis. 3. 5 mm right upper lobe pulmonary nodule. No follow-up needed if patient is low-risk. Non-contrast chest CT can be considered in 12 months if patient is high-risk. This recommendation follows the consensus statement: Guidelines for Management of Incidental Pulmonary Nodules Detected on CT Images: From the Fleischner Society 2017; Radiology 2017; 284:228-243. Electronically Signed   By: Marnee SpringJonathon  Watts M.D.   On: 06/26/2019 17:19   Ct Angio Neck W And/or Wo Contrast  Result Date: 06/26/2019 CLINICAL DATA:  L vision for 1 month EXAM: CT ANGIOGRAPHY HEAD AND NECK CT venogram TECHNIQUE: Multidetector CT imaging of the head and neck was performed using the standard protocol during bolus administration of intravenous contrast. Multiplanar CT image reconstructions and MIPs were obtained to evaluate the vascular anatomy. Carotid stenosis measurements (when applicable) are obtained utilizing NASCET criteria, using the distal internal carotid diameter as the denominator. A CT venogram was requested and a delayed phase was acquired, but was not needed given even better venous sided opacification on the CTA  portion of the study. CONTRAST:  100mL OMNIPAQUE IOHEXOL 350 MG/ML SOLN COMPARISON:  Brain MRI 06/09/2019 FINDINGS: CT HEAD FINDINGS Brain: No evidence of acute infarction, hemorrhage, hydrocephalus, extra-axial collection or mass lesion/mass effect. Vascular: Negative Skull: Negative Sinuses: Retention cyst in the left maxillary sinus Orbits: Negative.  No mass or inflammation to explain symptoms. Review of the MIP images confirms the above findings CTA NECK FINDINGS Aortic arch: Normal 4 vessel branching. No acute finding or dilatation. Right carotid system: Vessels are smooth and widely patent Left carotid system: The vessels are smooth and widely patent Vertebral arteries: Left vertebral artery arises from the arch.  No proximal subclavian stenosis. Vessels are smooth and widely patent to the dura. Skeleton: No acute or aggressive finding Other neck: 6 mm right upper lobe pulmonary nodule. Upper chest: Negative Review of the MIP images confirms the above findings CTA HEAD FINDINGS Anterior circulation: Vessels are smooth and widely patent. No atheromatous changes or stenosis. Negative for aneurysm Posterior circulation: Vessels are smooth and widely patent. No stenosis, aneurysm, or branch occlusion. Venous sinuses: The dural venous sinuses are diffusely patent without stenosis an arachnoid granulation is present along the right transverse sinus. Paired deep veins are also patent. Anatomic variants: None significant Elevated right diaphragm on scanogram. Delayed phase was acquired for CT venogram purposes. As above, these are less helpful than the CTA images and are also negative for dural venous sinus thrombosis. No evidence of abnormal parenchymal enhancement. Review of the MIP images confirms the above findings IMPRESSION: 1. Negative CTA of the head and neck. 2. No dural venous sinus thrombosis. 3. 5 mm right upper lobe pulmonary nodule. No follow-up needed if patient is low-risk. Non-contrast chest CT can be  considered in 12 months if patient is high-risk. This recommendation follows the consensus statement: Guidelines for Management of Incidental Pulmonary Nodules Detected on CT Images: From the Fleischner Society 2017; Radiology 2017; 284:228-243. Electronically Signed   By: Marnee Spring M.D.   On: 06/26/2019 17:19   Ct Venogram Head  Result Date: 06/26/2019 CLINICAL DATA:  L vision for 1 month EXAM: CT ANGIOGRAPHY HEAD AND NECK CT venogram TECHNIQUE: Multidetector CT imaging of the head and neck was performed using the standard protocol during bolus administration of intravenous contrast. Multiplanar CT image reconstructions and MIPs were obtained to evaluate the vascular anatomy. Carotid stenosis measurements (when applicable) are obtained utilizing NASCET criteria, using the distal internal carotid diameter as the denominator. A CT venogram was requested and a delayed phase was acquired, but was not needed given even better venous sided opacification on the CTA portion of the study. CONTRAST:  OMNIPAQUE IOHEXOL 350 MG/ML SOLN COMPARISON:  Brain MRI 06/09/2019 FINDINGS: CT HEAD FINDINGS Brain: No evidence of acute infarction, hemorrhage, hydrocephalus, extra-axial collection or mass lesion/mass effect. Vascular: Negative Skull: Negative Sinuses: Retention cyst in the left maxillary sinus Orbits: Negative.  No mass or inflammation to explain symptoms. Review of the MIP images confirms the above findings CTA NECK FINDINGS Aortic arch: Normal 4 vessel branching. No acute finding or dilatation. Right carotid system: Vessels are smooth and widely patent Left carotid system: The vessels are smooth and widely patent Vertebral arteries: Left vertebral artery arises from the arch. No proximal subclavian stenosis. Vessels are smooth and widely patent to the dura. Skeleton: No acute or aggressive finding Other neck: 6 mm right upper lobe pulmonary nodule. Upper chest: Negative Review of the MIP images confirms the  above findings CTA HEAD FINDINGS Anterior circulation: Vessels are smooth and widely patent. No atheromatous changes or stenosis. Negative for aneurysm Posterior circulation: Vessels are smooth and widely patent. No stenosis, aneurysm, or branch occlusion. Venous sinuses: The dural venous sinuses are diffusely patent without stenosis an arachnoid granulation is present along the right transverse sinus. Paired deep veins are also patent. Anatomic variants: None significant Elevated right diaphragm on scanogram. Delayed phase was acquired for CT venogram purposes. As above, these are less helpful than the CTA images and are also negative for dural venous sinus thrombosis. No evidence of abnormal parenchymal enhancement. Review of the MIP images confirms the above findings IMPRESSION: 1. Negative CTA  of the head and neck. 2. No dural venous sinus thrombosis. 3. 5 mm right upper lobe pulmonary nodule. No follow-up needed if patient is low-risk. Non-contrast chest CT can be considered in 12 months if patient is high-risk. This recommendation follows the consensus statement: Guidelines for Management of Incidental Pulmonary Nodules Detected on CT Images: From the Fleischner Society 2017; Radiology 2017; 284:228-243. Electronically Signed   By: Monte Fantasia M.D.   On: 06/26/2019 17:19    Procedures Procedures (including critical care time)  Medications Ordered in ED Medications  sodium chloride 0.9 % bolus 1,000 mL (0 mLs Intravenous Stopped 06/26/19 1631)  prochlorperazine (COMPAZINE) injection 10 mg (10 mg Intravenous Given 06/26/19 1513)  diphenhydrAMINE (BENADRYL) injection 25 mg (25 mg Intravenous Given 06/26/19 1513)  iohexol (OMNIPAQUE) 350 MG/ML injection 100 mL (100 mLs Intravenous Contrast Given 06/26/19 1627)     Initial Impression / Assessment and Plan / ED Course  I have reviewed the triage vital signs and the nursing notes.  Pertinent labs & imaging results that were available during my care  of the patient were reviewed by me and considered in my medical decision making (see chart for details).        53yo female with history of DM, migraines, presents with 4 weeks of progressive headache and diplopia.  Exam shows 6th nerve palsy.  Had MRI showing no acute findings after symptoms developed.  Labs ordered.  DDx includes sinus venous thrombosis, isolated 6th nerve palsy, less likely benign intracranial hypertension, temporal arteritis.  Discussed with Dr. Lorraine Lax who recommends CTA/CT venogram.  Labs and CT pending at this time.  If no acute findings, pt may follow up with Neurology and Ophthalmology, use eye patch to help with symptom relief.      Final Clinical Impressions(s) / ED Diagnoses   Final diagnoses:  6th nerve palsy, unspecified laterality    ED Discharge Orders    None       Gareth Morgan, MD 06/26/19 2237

## 2019-06-26 NOTE — ED Triage Notes (Addendum)
Diabetic. 4 weeks of double vision. Months of headaches. Pt appears anxious and is very easily agitated with triage process.

## 2019-06-26 NOTE — Discharge Instructions (Addendum)
Follow-up with the neurologist as directed.  Return here as needed for any worsening symptoms

## 2019-06-28 LAB — URINE CULTURE: Culture: >=100000 — AB

## 2019-06-29 ENCOUNTER — Telehealth: Payer: Self-pay | Admitting: *Deleted

## 2019-06-29 NOTE — Telephone Encounter (Signed)
Post ED Visit - Positive Culture Follow-up  Culture report reviewed by antimicrobial stewardship pharmacist: [place] Team []  Elenor Quinones, Pharm.D. []  Heide Guile, Pharm.D., BCPS AQ-ID []  Parks Neptune, Pharm.D., BCPS []  Alycia Rossetti, Pharm.D., BCPS []  [place], [place].D., BCPS, AAHIVP []  Legrand Como, Pharm.D., BCPS, AAHIVP []  Salome Arnt, PharmD, BCPS []  Johnnette Gourd, PharmD, BCPS []  Hughes Better, PharmD, BCPS []  Leeroy Cha, PharmD []  Laqueta Linden, PharmD, BCPS []  Albertina Parr, PharmD  [place] Team []  Leodis Sias, PharmD []  Lindell Spar, PharmD []  Royetta Asal, PharmD []  Graylin Shiver, Rph []  Rema Fendt) Glennon Mac, PharmD []  Arlyn Dunning, PharmD []  Netta Cedars, PharmD []  Dia Sitter, PharmD []  Leone Haven, PharmD []  Gretta Arab, PharmD []  Theodis Shove, PharmD []  Peggyann Juba, PharmD []  Reuel Boom, PharmD   Positive urine culture, reviewed by Lenn Sink, PA-C No urinary symptoms and no further patient follow-up is required at this time.  Rika, [facility] 06/29/2019, 10:46 AM

## 2021-07-28 ENCOUNTER — Emergency Department (HOSPITAL_BASED_OUTPATIENT_CLINIC_OR_DEPARTMENT_OTHER)
Admission: EM | Admit: 2021-07-28 | Discharge: 2021-07-28 | Disposition: A | Discharge status: Home / Self Care | Payer: Medicaid Other | Attending: Emergency Medicine | Admitting: Emergency Medicine

## 2021-07-28 ENCOUNTER — Emergency Department (HOSPITAL_BASED_OUTPATIENT_CLINIC_OR_DEPARTMENT_OTHER): Payer: Medicaid Other

## 2021-07-28 ENCOUNTER — Other Ambulatory Visit: Payer: Self-pay

## 2021-07-28 ENCOUNTER — Emergency Department (HOSPITAL_COMMUNITY): Payer: Medicaid Other

## 2021-07-28 DIAGNOSIS — H4932 Total (external) ophthalmoplegia, left eye: Secondary | ICD-10-CM | POA: Insufficient documentation

## 2021-07-28 DIAGNOSIS — M79605 Pain in left leg: Secondary | ICD-10-CM | POA: Insufficient documentation

## 2021-07-28 DIAGNOSIS — Z794 Long term (current) use of insulin: Secondary | ICD-10-CM | POA: Insufficient documentation

## 2021-07-28 DIAGNOSIS — E119 Type 2 diabetes mellitus without complications: Secondary | ICD-10-CM | POA: Diagnosis not present

## 2021-07-28 DIAGNOSIS — Z20822 Contact with and (suspected) exposure to covid-19: Secondary | ICD-10-CM | POA: Insufficient documentation

## 2021-07-28 DIAGNOSIS — Z79899 Other long term (current) drug therapy: Secondary | ICD-10-CM | POA: Insufficient documentation

## 2021-07-28 DIAGNOSIS — H499 Unspecified paralytic strabismus: Secondary | ICD-10-CM

## 2021-07-28 DIAGNOSIS — G529 Cranial nerve disorder, unspecified: Secondary | ICD-10-CM | POA: Diagnosis not present

## 2021-07-28 DIAGNOSIS — H5712 Ocular pain, left eye: Secondary | ICD-10-CM | POA: Diagnosis present

## 2021-07-28 LAB — RAPID URINE DRUG SCREEN, HOSP PERFORMED
Amphetamines: NOT DETECTED
Barbiturates: NOT DETECTED
Benzodiazepines: NOT DETECTED
Cocaine: NOT DETECTED
Opiates: NOT DETECTED
Tetrahydrocannabinol: NOT DETECTED

## 2021-07-28 LAB — COMPREHENSIVE METABOLIC PANEL
ALT: 17 U/L (ref 0–44)
AST: 14 U/L — ABNORMAL LOW (ref 15–41)
Albumin: 4.1 g/dL (ref 3.5–5.0)
Alkaline Phosphatase: 74 U/L (ref 38–126)
Anion gap: 11 (ref 5–15)
BUN: 17 mg/dL (ref 6–20)
CO2: 23 mmol/L (ref 22–32)
Calcium: 9.2 mg/dL (ref 8.9–10.3)
Chloride: 101 mmol/L (ref 98–111)
Creatinine, Ser: 0.79 mg/dL (ref 0.44–1.00)
GFR, Estimated: >60 mL/min (ref >60)
Glucose, Bld: 188 mg/dL — ABNORMAL HIGH (ref 70–99)
Potassium: 4 mmol/L (ref 3.5–5.1)
Sodium: 135 mmol/L (ref 135–145)
Total Bilirubin: 0.6 mg/dL (ref 0.3–1.2)
Total Protein: 8.2 g/dL — ABNORMAL HIGH (ref 6.5–8.1)

## 2021-07-28 LAB — URINALYSIS, ROUTINE W REFLEX MICROSCOPIC
Bilirubin Urine: NEGATIVE
Glucose, UA: >=500 mg/dL — AB
Hgb urine dipstick: NEGATIVE
Ketones, ur: 80 mg/dL — AB
Leukocytes,Ua: NEGATIVE
Nitrite: NEGATIVE
Protein, ur: NEGATIVE mg/dL
Specific Gravity, Urine: 1.015 (ref 1.005–1.030)
pH: 5 (ref 5.0–8.0)

## 2021-07-28 LAB — URINALYSIS, MICROSCOPIC (REFLEX)

## 2021-07-28 LAB — DIFFERENTIAL
Abs Immature Granulocytes: 0.12 10*3/uL — ABNORMAL HIGH (ref 0.00–0.07)
Basophils Absolute: 0.1 10*3/uL (ref 0.0–0.1)
Basophils Relative: 0 %
Eosinophils Absolute: 0.1 10*3/uL (ref 0.0–0.5)
Eosinophils Relative: 1 %
Immature Granulocytes: 1 %
Lymphocytes Relative: 22 %
Lymphs Abs: 3 10*3/uL (ref 0.7–4.0)
Monocytes Absolute: 0.5 10*3/uL (ref 0.1–1.0)
Monocytes Relative: 4 %
Neutro Abs: 9.8 10*3/uL — ABNORMAL HIGH (ref 1.7–7.7)
Neutrophils Relative %: 72 %

## 2021-07-28 LAB — CBC
HCT: 49.6 % — ABNORMAL HIGH (ref 36.0–46.0)
Hemoglobin: 16.4 g/dL — ABNORMAL HIGH (ref 12.0–15.0)
MCH: 29.8 pg (ref 26.0–34.0)
MCHC: 33.1 g/dL (ref 30.0–36.0)
MCV: 90 fL (ref 80.0–100.0)
Platelets: 399 10*3/uL (ref 150–400)
RBC: 5.51 MIL/uL — ABNORMAL HIGH (ref 3.87–5.11)
RDW: 12.5 % (ref 11.5–15.5)
WBC: 13.6 10*3/uL — ABNORMAL HIGH (ref 4.0–10.5)
nRBC: 0 % (ref 0.0–0.2)

## 2021-07-28 LAB — HEMOGLOBIN A1C
Hgb A1c MFr Bld: 9.8 % — ABNORMAL HIGH (ref 4.8–5.6)
Mean Plasma Glucose: 234.56 mg/dL

## 2021-07-28 LAB — PROTIME-INR
INR: 1 (ref 0.8–1.2)
Prothrombin Time: 12.8 seconds (ref 11.4–15.2)

## 2021-07-28 LAB — TSH: TSH: 1.035 u[IU]/mL (ref 0.350–4.500)

## 2021-07-28 LAB — SEDIMENTATION RATE: Sed Rate: 10 mm/hr (ref 0–22)

## 2021-07-28 LAB — RESP PANEL BY RT-PCR (FLU A&B, COVID) ARPGX2
Influenza A by PCR: NEGATIVE
Influenza B by PCR: NEGATIVE
SARS Coronavirus 2 by RT PCR: NEGATIVE

## 2021-07-28 LAB — APTT: aPTT: 29 seconds (ref 24–36)

## 2021-07-28 LAB — CBG MONITORING, ED: Glucose-Capillary: 176 mg/dL — ABNORMAL HIGH (ref 70–99)

## 2021-07-28 IMAGING — MR MR HEAD WO/W CM
6 of 12 series · 26 of 48 positions shown · IV contrast (gadavist)
Comparison: CT head from the same day.

CLINICAL DATA: NEURO DEFICIT

EXAM:
MRI HEAD WITHOUT AND WITH CONTRAST
MRA HEAD WITHOUT CONTRAST
TECHNIQUE: Multiplanar, multiecho pulse sequences of the brain and surrounding
structures were obtained without and with intravenous contrast.
Angiographic images of the Circle of Willis were obtained using MRA
technique without intravenous contrast.
CONTRAST:  10mL GADAVIST GADOBUTROL 1 MMOL/ML IV SOLN

[Series 2: DWI · axial · 3.0mm · 0.94mm/px · z∈[-59,+93]mm · 9 of 104 slices shown (1 of 2)]
[im 1/104]
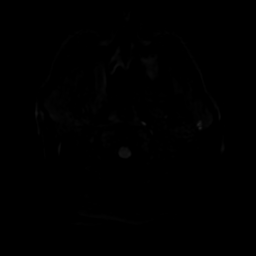
[im 13/104]
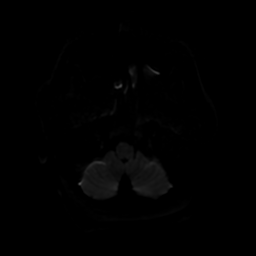
[im 26/104]
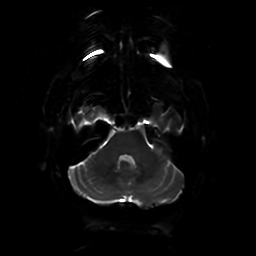
[im 39/104]
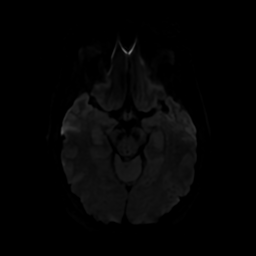
[im 52/104]
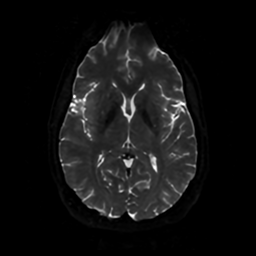
[im 65/104]
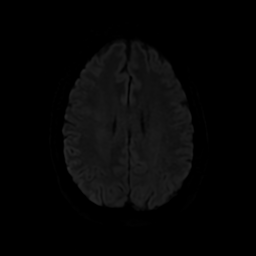
[im 78/104]
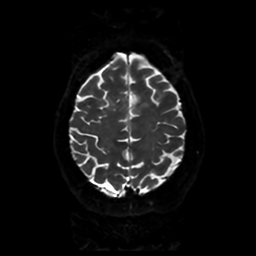
[im 91/104]
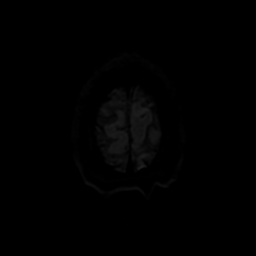
[im 104/104]
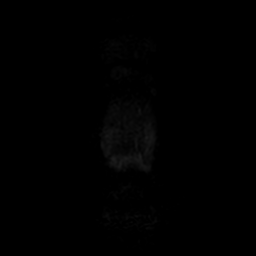

[Series 4: DWI · coronal · 4.0mm · 0.94mm/px · 6 of 76 slices shown (2 of 2)]
[im 1/76]
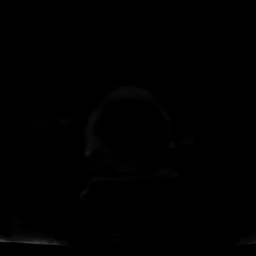
[im 16/76]
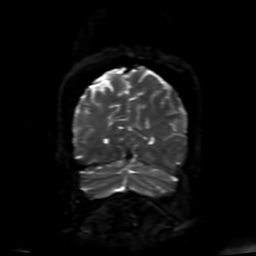
[im 31/76]
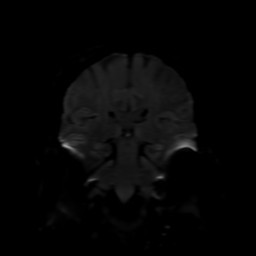
[im 46/76]
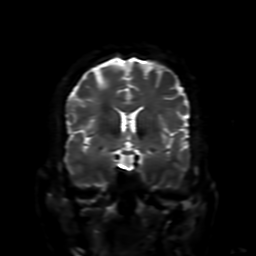
[im 61/76]
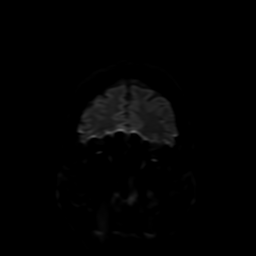
[im 76/76]
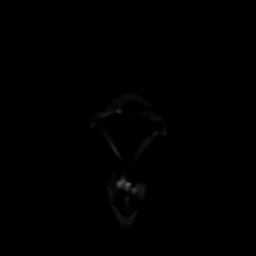

[Series 5: FLAIR · sagittal · 5.0mm · 0.23mm/px · 2 of 23 slices shown (1 of 2)]
[im 1/23]
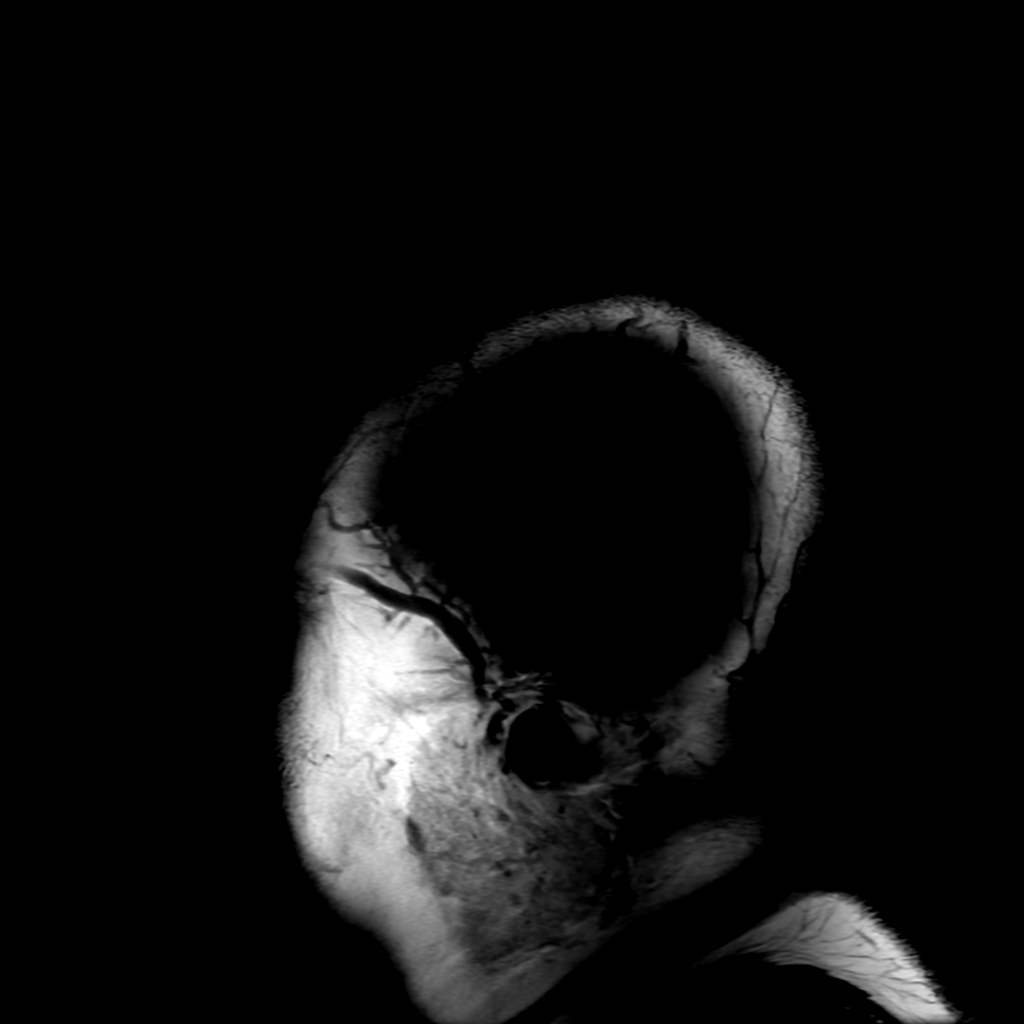
[im 23/23]
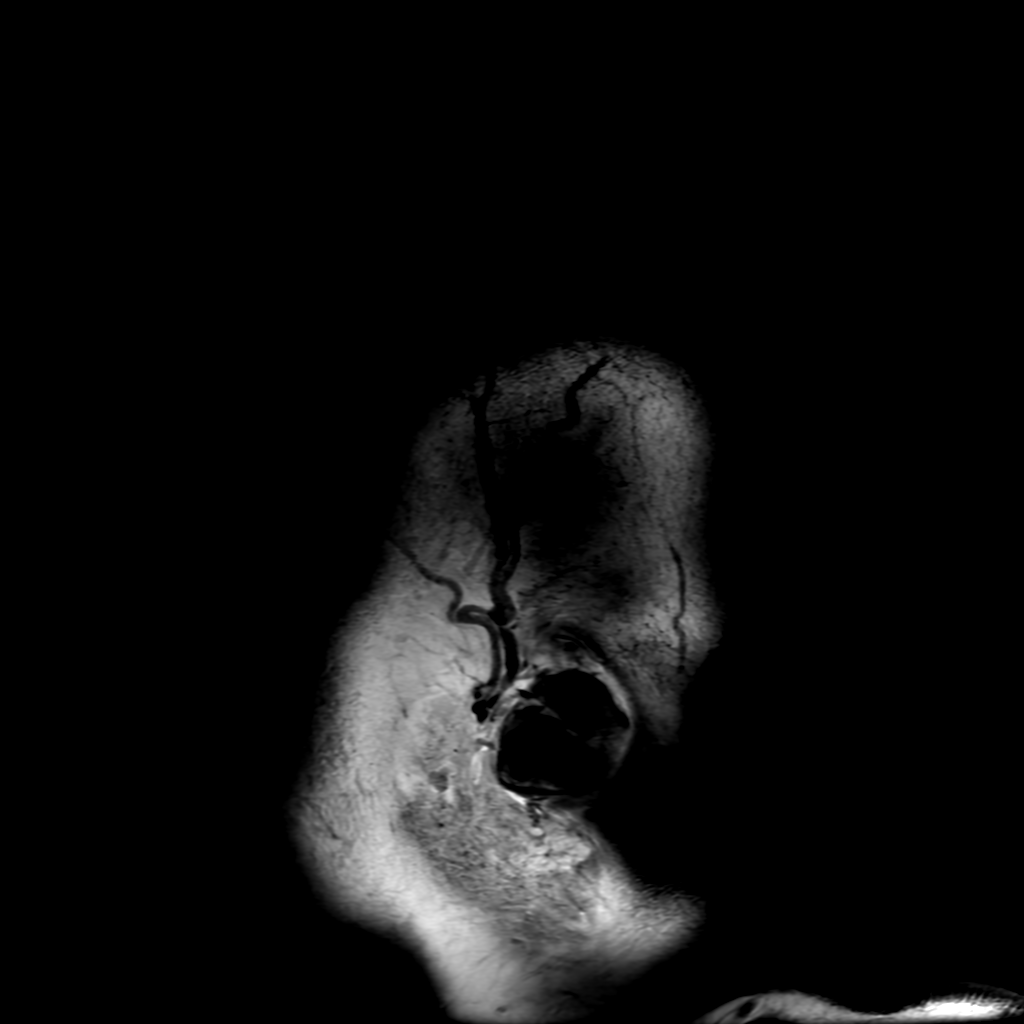

[Series 7: FLAIR · axial · 5.0mm · 0.47mm/px · z∈[-57,+92]mm · 2 of 26 slices shown (2 of 2)]
[im 1/26]
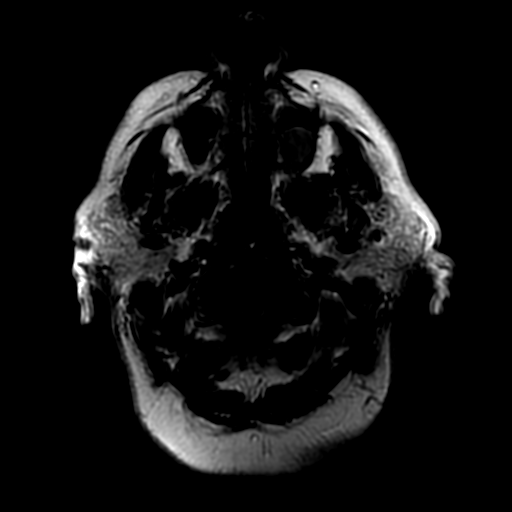
[im 26/26]
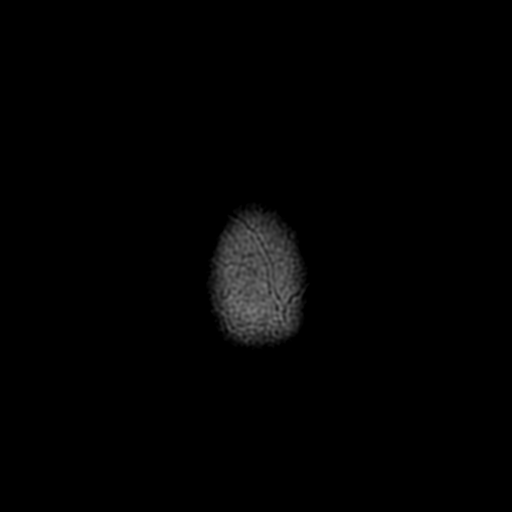

[Series 250: ADC · axial · 3.0mm · 0.94mm/px · z∈[-59,+93]mm · 4 of 52 slices shown (1 of 2)]
[im 1/52]
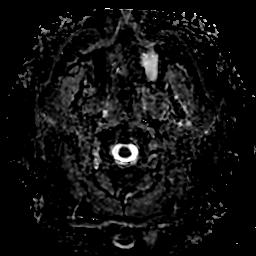
[im 18/52]
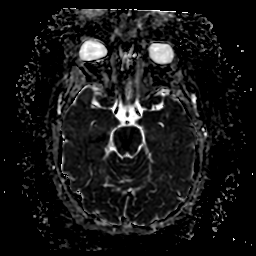
[im 35/52]
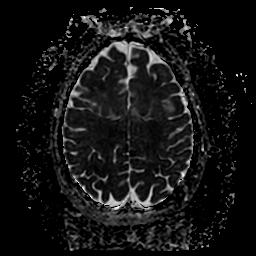
[im 52/52]
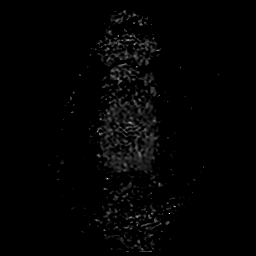

[Series 450: ADC · coronal · 4.0mm · 0.94mm/px · 3 of 38 slices shown (2 of 2)]
[im 1/38]
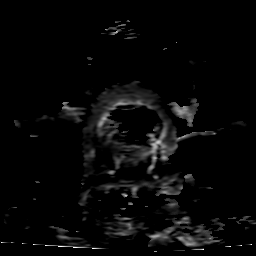
[im 19/38]
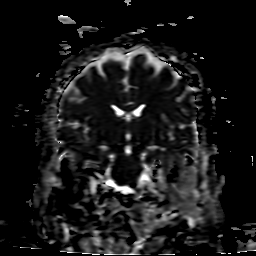
[im 38/38]
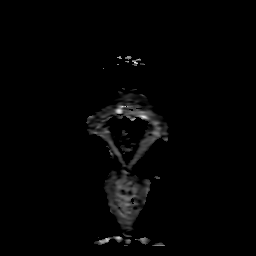

[26 of 48 positions shown; findings below may reference images not displayed]

FINDINGS: MRI HEAD FINDINGS

Brain: No acute infarction, hemorrhage, hydrocephalus, extra-axial
collection or mass lesion. No abnormal enhancement.

Vascular: See below.

Skull and upper cervical spine: Normal marrow signal.

Sinuses and orbits: Please see concurrent MRI orbits for evaluation.

Other: No mastoid effusions.

MRA HEAD FINDINGS

Anterior circulation: Bilateral intracranial ICAs, MCAs, and ACAs
are patent without proximal hemodynamically significant stenosis.
Trifurcate ACA. No aneurysm identified.

Posterior circulation: Bilateral intradural vertebral arteries,
basilar artery, and posterior cerebral arteries are patent without
proximal hemodynamically significant stenosis. No aneurysm
identified.
IMPRESSION: MRI head:

No evidence of acute intracranial abnormality.

MRA:

No large vessel occlusion or hemodynamically significant stenosis.

## 2021-07-28 IMAGING — MR MR ORBITS WO/W CM
4 of 8 series · 19 of 48 positions shown · IV contrast (cc gad)
Comparison: CT head from same day.

CLINICAL DATA: Diplopia

EXAM:
MRI OF THE ORBITS WITHOUT AND WITH CONTRAST
TECHNIQUE: Multiplanar, multi-echo pulse sequences of the orbits and
surrounding structures were acquired including fat saturation
techniques, before and after intravenous contrast administration.
CONTRAST:  10mL GADAVIST GADOBUTROL 1 MMOL/ML IV SOLN

[Series 10: T2 fat-sat · coronal · 4.0mm · 0.35mm/px · 6 of 22 slices shown (1 of 4)]
[im 1/22]
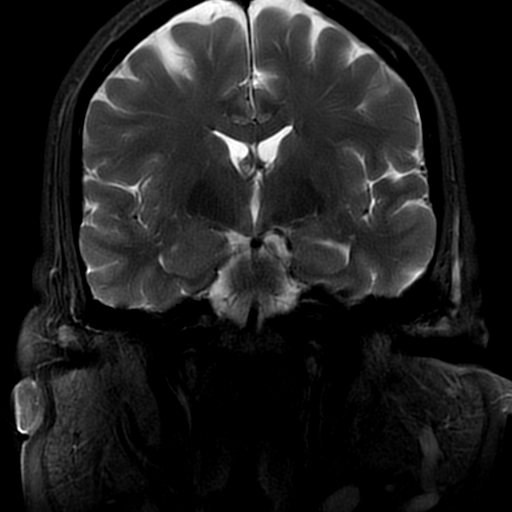
[im 5/22]
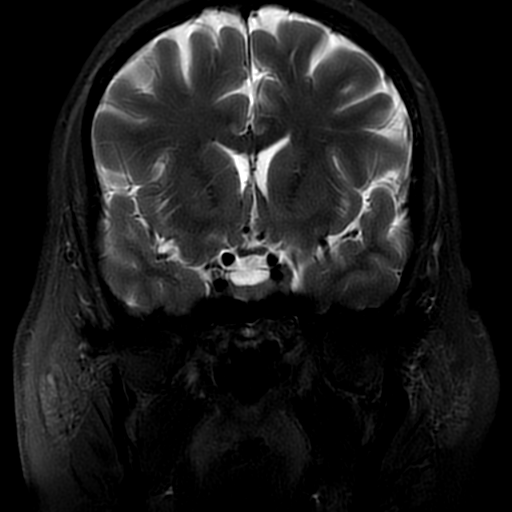
[im 9/22]
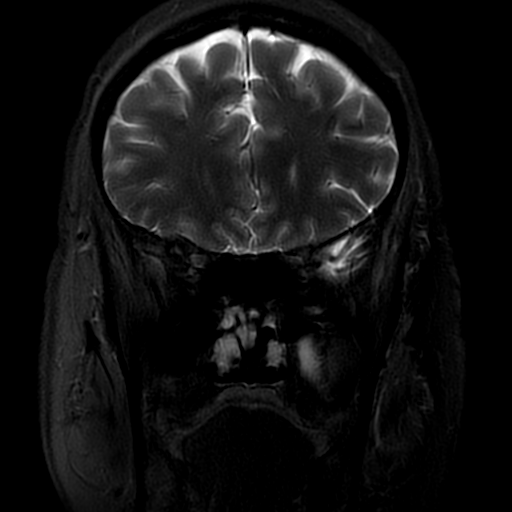
[im 13/22]
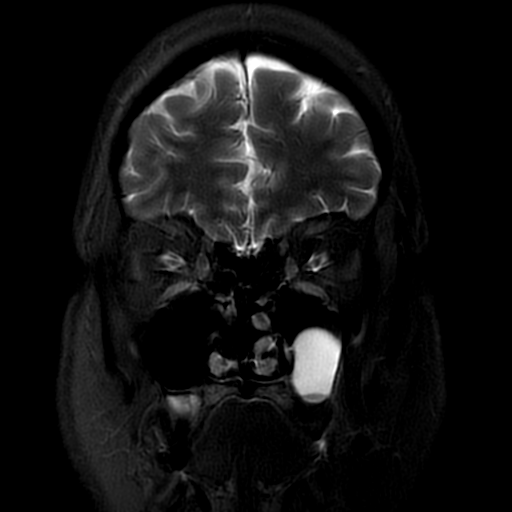
[im 17/22]
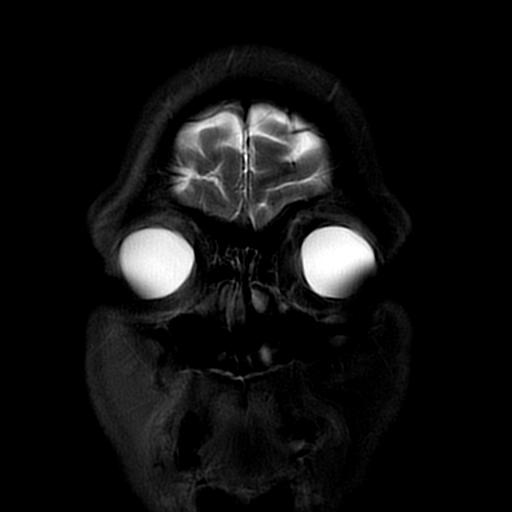
[im 22/22]
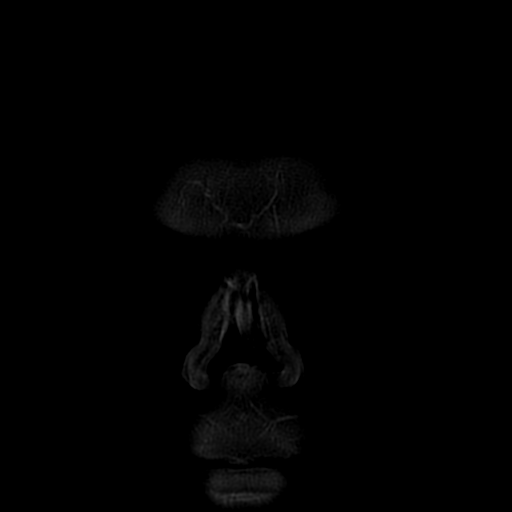

[Series 11: T2 fat-sat · axial · 3.0mm · 0.35mm/px · z∈[-45,+12]mm · 6 of 20 slices shown (2 of 4)]
[im 1/20]
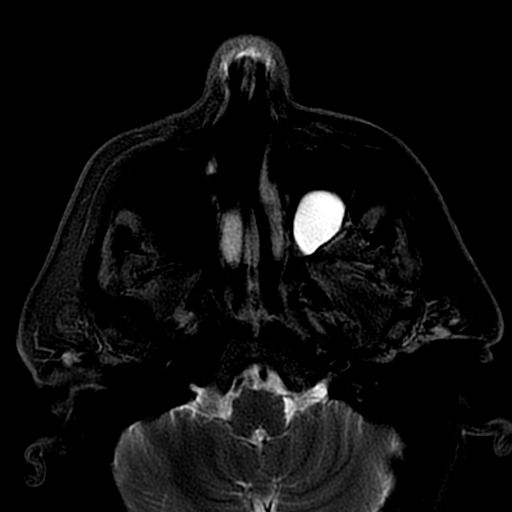
[im 4/20]
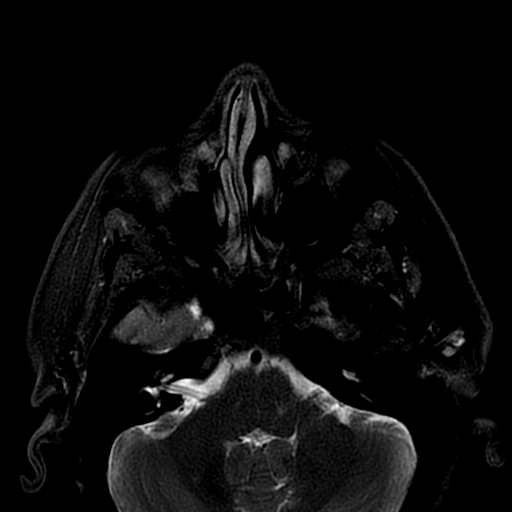
[im 8/20]
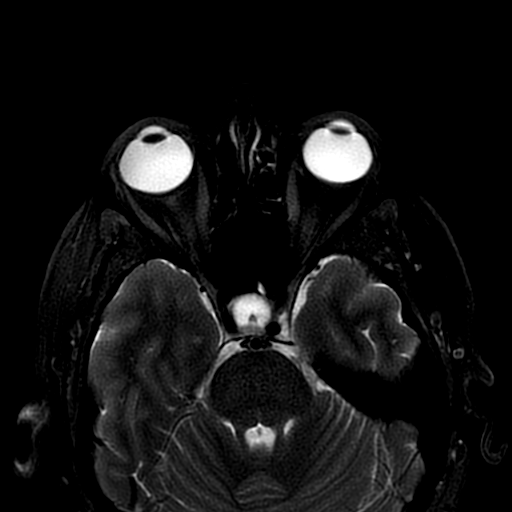
[im 12/20]
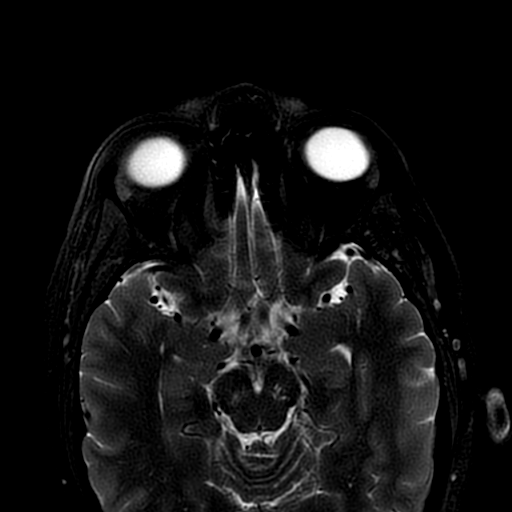
[im 16/20]
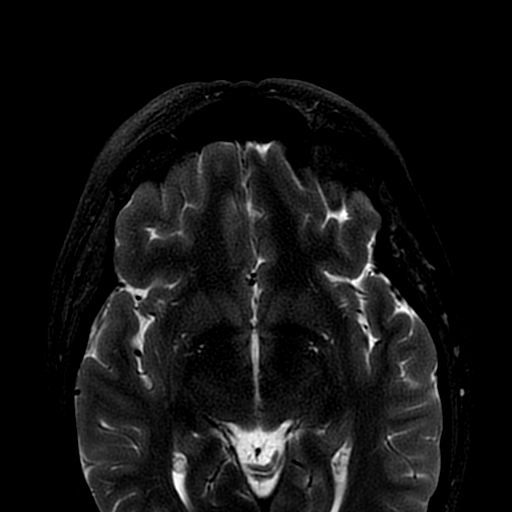
[im 20/20]
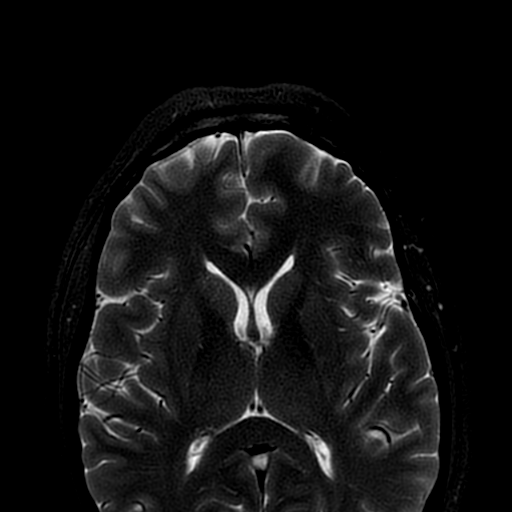

[Series 17: T2 fat-sat · coronal · 4.0mm · 0.35mm/px · 4 of 22 slices shown (3 of 4)]
[im 1/22]
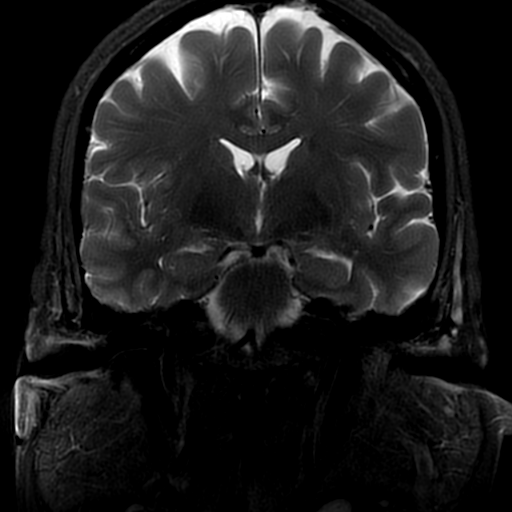
[im 5/22]
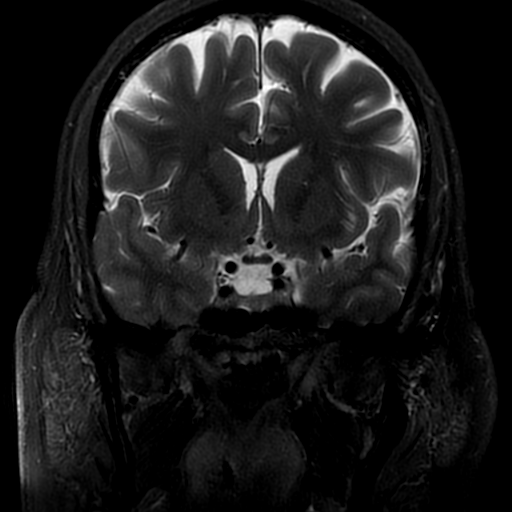
[im 13/22]
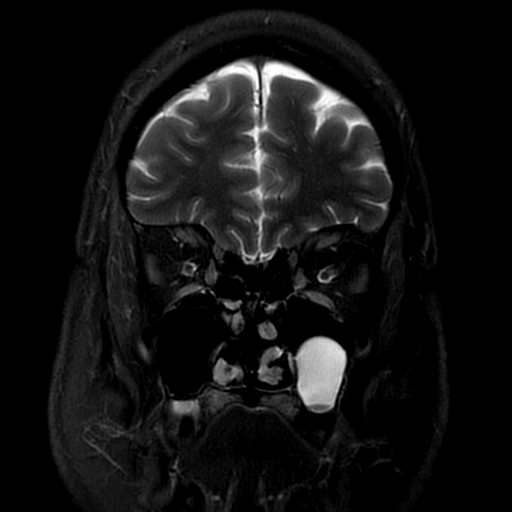
[im 22/22]
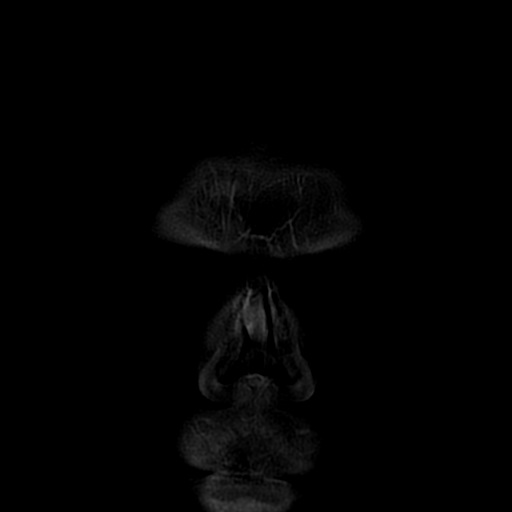

[Series 18: T2 fat-sat · axial · 3.0mm · 0.35mm/px · z∈[-35,+13]mm · 3 of 20 slices shown (4 of 4)]
[im 4/20]
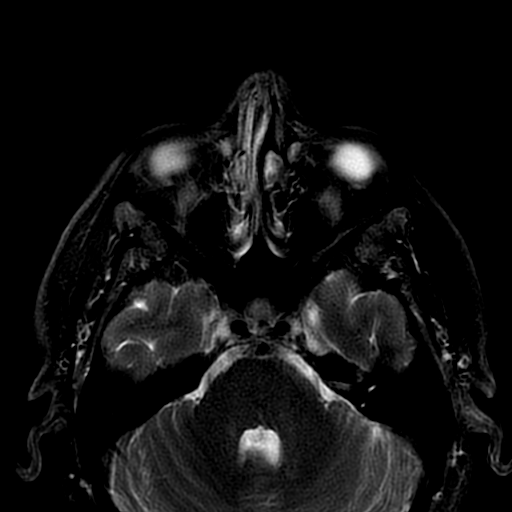
[im 12/20]
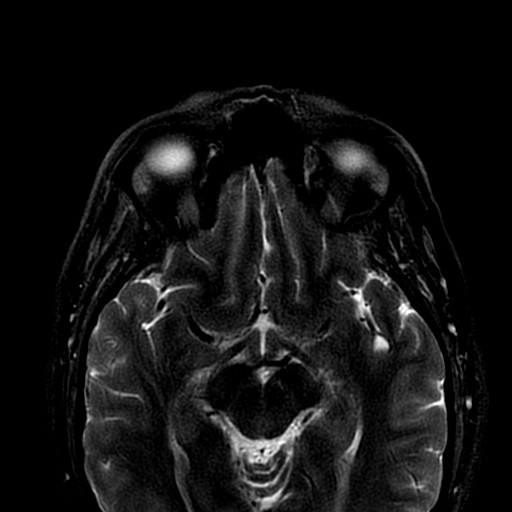
[im 20/20]
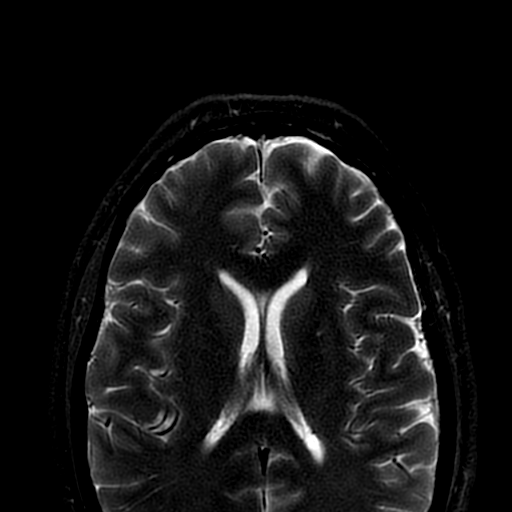

[19 of 48 positions shown; findings below may reference images not displayed]

FINDINGS: Orbits: No orbital mass or evidence of inflammation. Normal
appearance of the globes, optic nerve-sheath complexes, extraocular
muscles, orbital fat and lacrimal glands. Motion limited
postcontrast imaging without obvious enhancement.

Visualized sinuses: Retention cyst in the left maxillary sinus. Mild
ethmoid air cell mucosal thickening. Otherwise, clear visualized
sinuses.

Soft tissues: Normal.

Limited intracranial: See same day MRI head.
IMPRESSION: No evidence of acute orbital abnormality on this motion limited
study.

## 2021-07-28 IMAGING — MR MR MRA HEAD W/O CM
2 series · 19 of 48 positions shown · IV contrast (gadavist)
Comparison: CT head from the same day.

CLINICAL DATA: NEURO DEFICIT

EXAM:
MRI HEAD WITHOUT AND WITH CONTRAST
MRA HEAD WITHOUT CONTRAST
TECHNIQUE: Multiplanar, multiecho pulse sequences of the brain and surrounding
structures were obtained without and with intravenous contrast.
Angiographic images of the Circle of Willis were obtained using MRA
technique without intravenous contrast.
CONTRAST:  10mL GADAVIST GADOBUTROL 1 MMOL/ML IV SOLN

[Series 3: ax (id) · axial · 1.0mm · 0.43mm/px · z∈[-59,+24]mm · 18 of 176 slices shown]
[im 1/176]
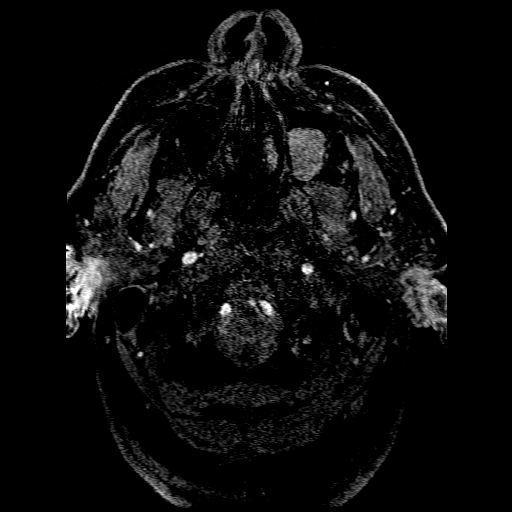
[im 4/176]
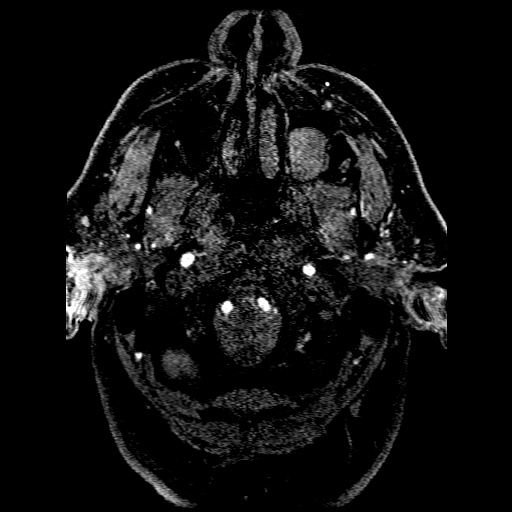
[im 8/176]
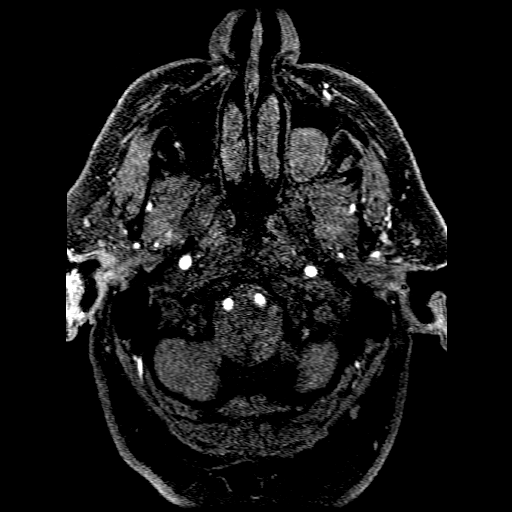
[im 12/176]
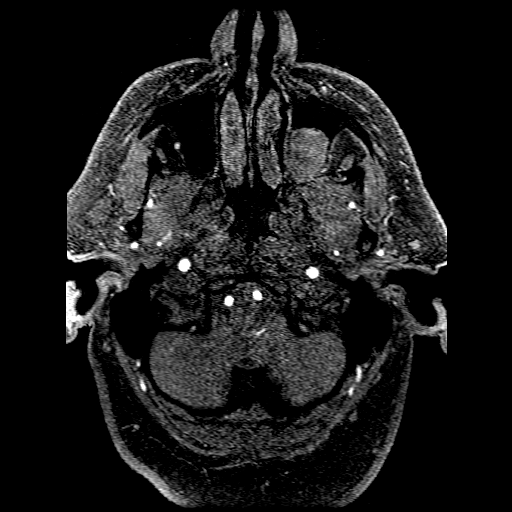
[im 16/176]
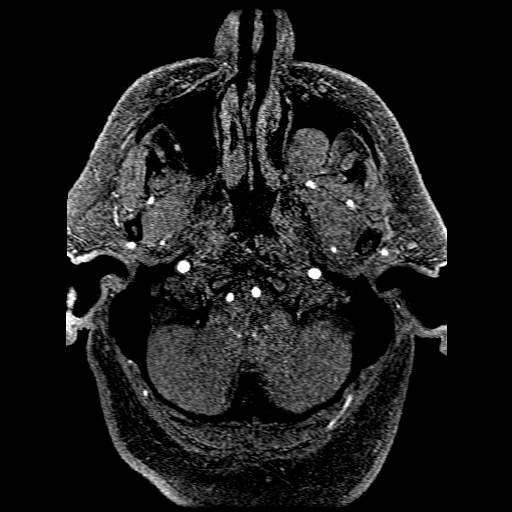
[im 20/176]
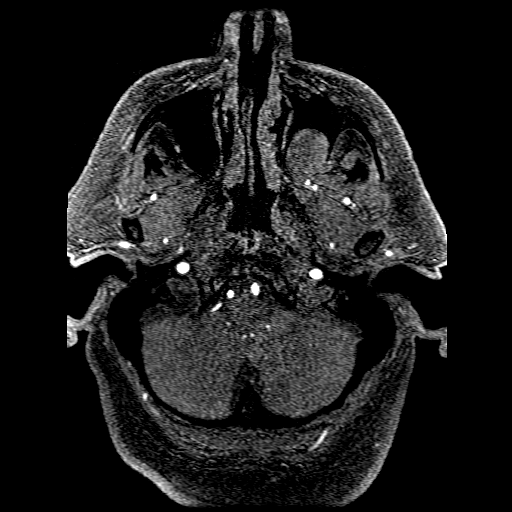
[im 23/176]
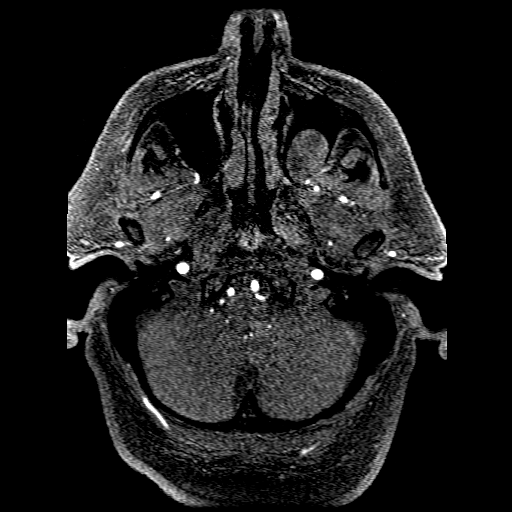
[im 27/176]
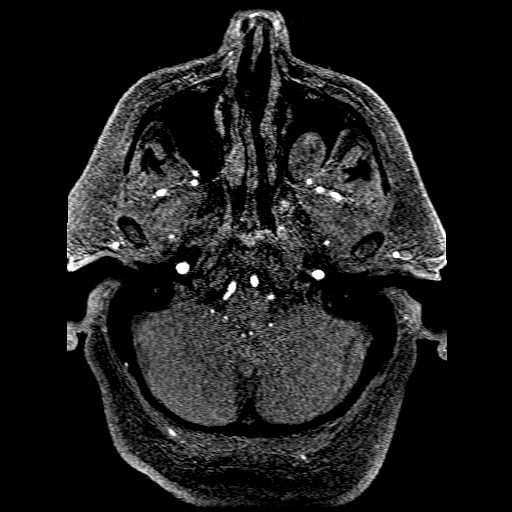
[im 31/176]
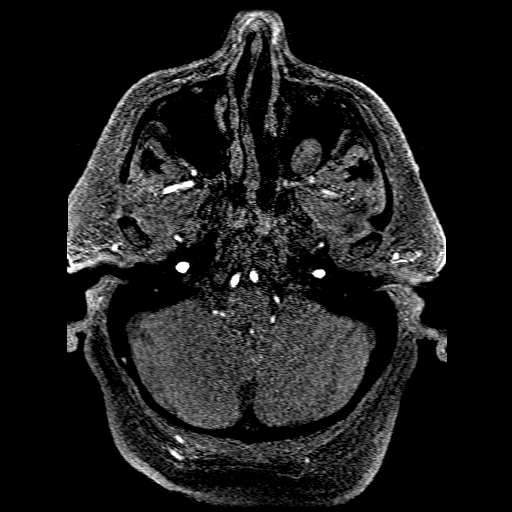
[im 35/176]
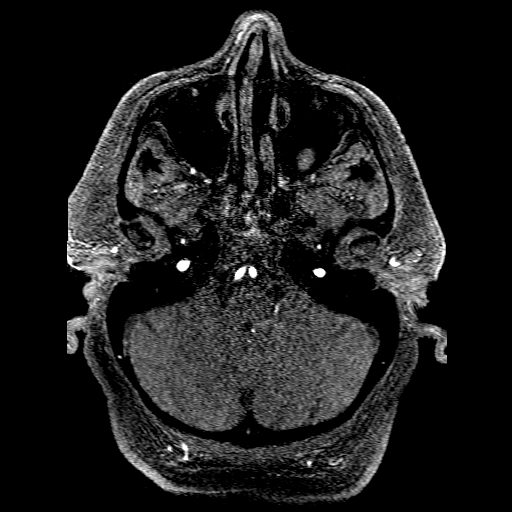
[im 54/176]
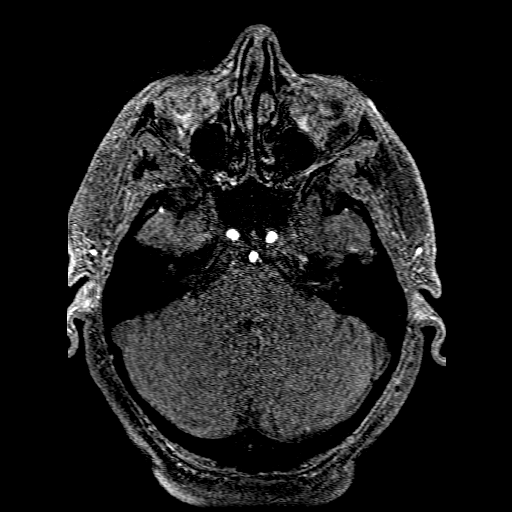
[im 77/176]
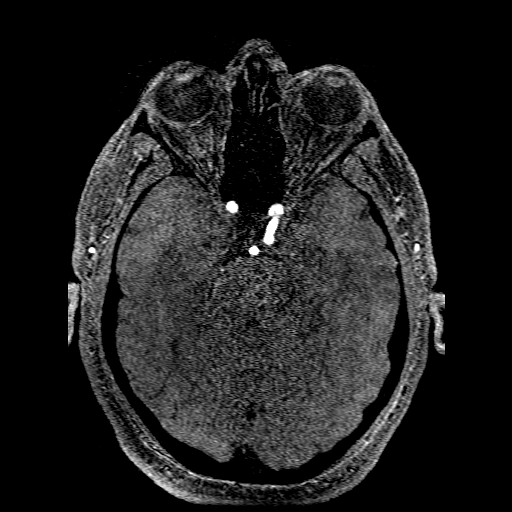
[im 88/176]
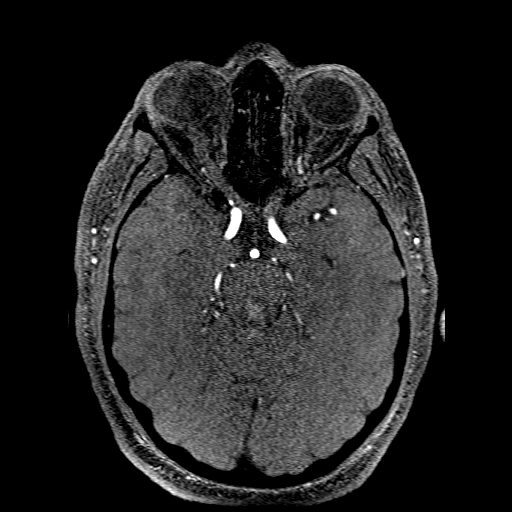
[im 99/176]
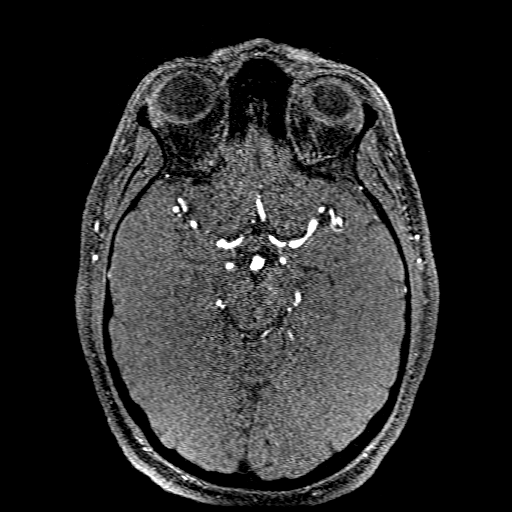
[im 122/176]
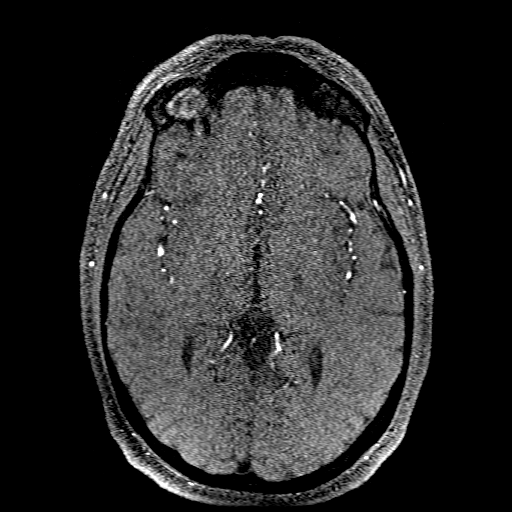
[im 145/176]
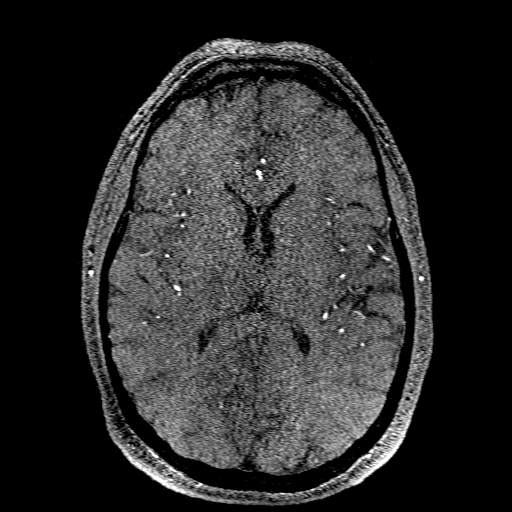
[im 149/176]
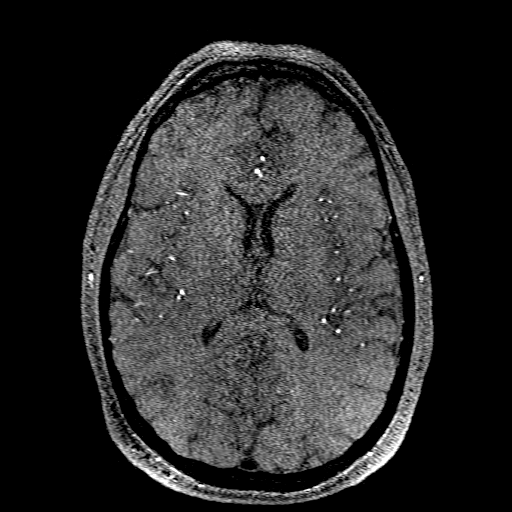
[im 168/176]
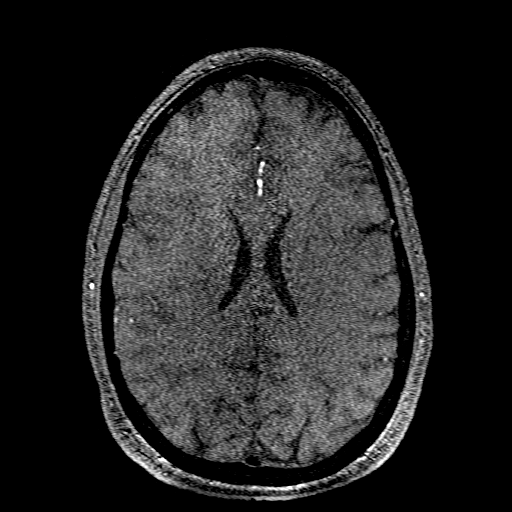

[Series 301: pjn:ax (id) · sagittal · 1.0mm · 0.43mm/px · 1 of 3 slices shown]
[im 1/3]
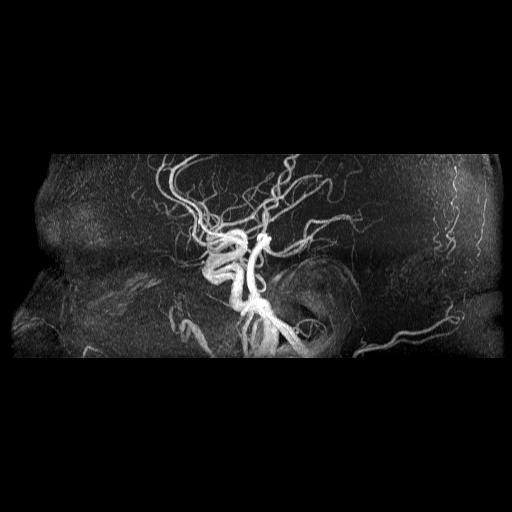

[19 of 48 positions shown; findings below may reference images not displayed]

FINDINGS: MRI HEAD FINDINGS

Brain: No acute infarction, hemorrhage, hydrocephalus, extra-axial
collection or mass lesion. No abnormal enhancement.

Vascular: See below.

Skull and upper cervical spine: Normal marrow signal.

Sinuses and orbits: Please see concurrent MRI orbits for evaluation.

Other: No mastoid effusions.

MRA HEAD FINDINGS

Anterior circulation: Bilateral intracranial ICAs, MCAs, and ACAs
are patent without proximal hemodynamically significant stenosis.
Trifurcate ACA. No aneurysm identified.

Posterior circulation: Bilateral intradural vertebral arteries,
basilar artery, and posterior cerebral arteries are patent without
proximal hemodynamically significant stenosis. No aneurysm
identified.
IMPRESSION: MRI head:

No evidence of acute intracranial abnormality.

MRA:

No large vessel occlusion or hemodynamically significant stenosis.

## 2021-07-28 IMAGING — CT CT HEAD W/O CM
3 series · 16 of 47 positions shown, 19 images · non-contrast
Comparison: Brain MRI [DATE]

CLINICAL DATA: Neuro deficit. Stroke suspected. Left eye double
vision for 1 week.

EXAM:
CT HEAD WITHOUT CONTRAST
TECHNIQUE: Contiguous axial images were obtained from the base of the skull
through the vertex without intravenous contrast.

[Series 2: head wo · axial · 0.44mm/px · z∈[-149,-24]mm · 10 of 31 slices shown, 13 images]
[im 3/31  brain]
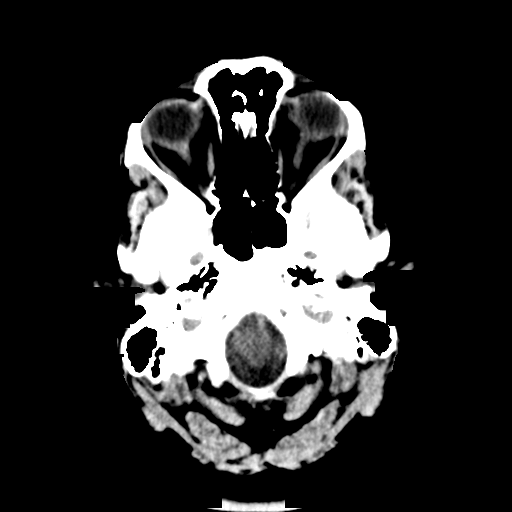
[im 3/31  bone]
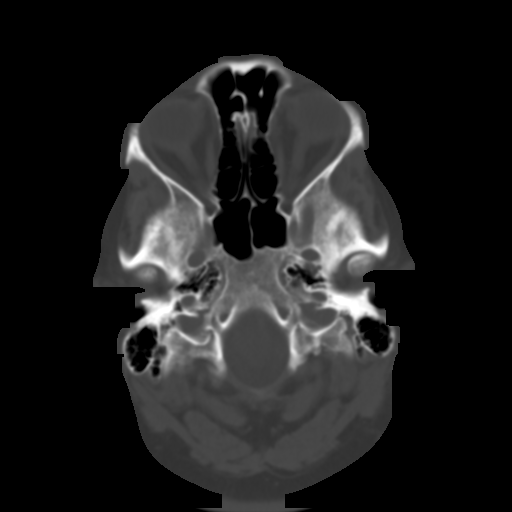
[im 6/31  brain]
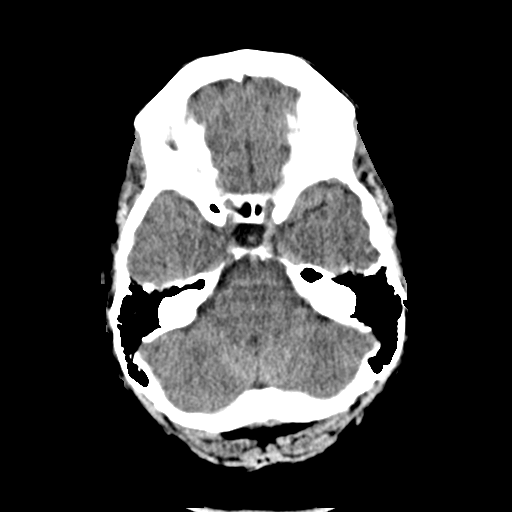
[im 9/31  brain]
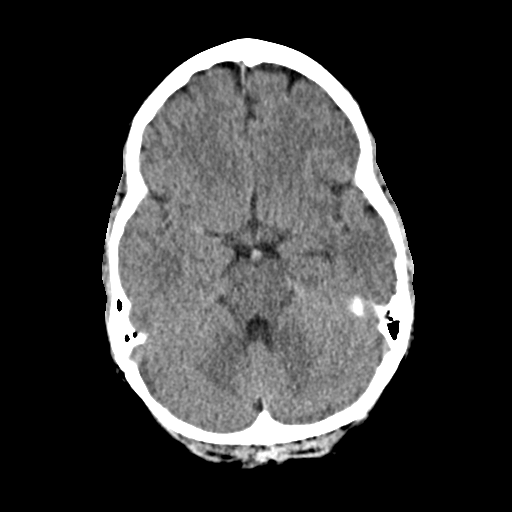
[im 11/31  brain]
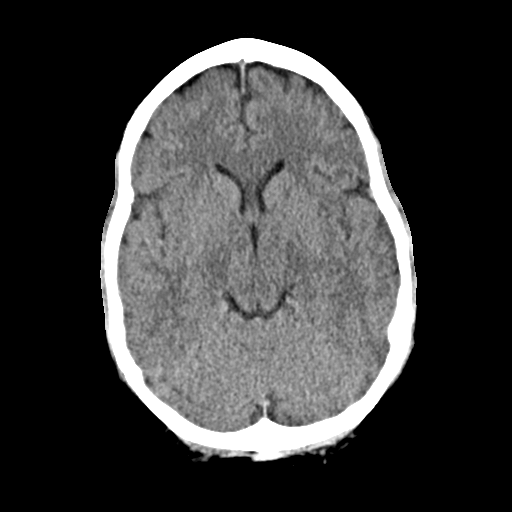
[im 14/31  brain]
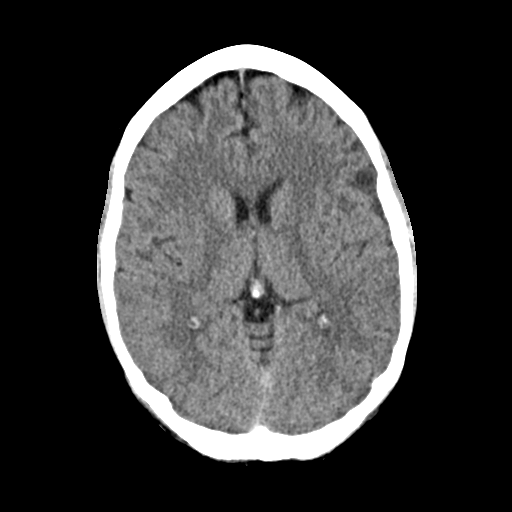
[im 14/31  bone]
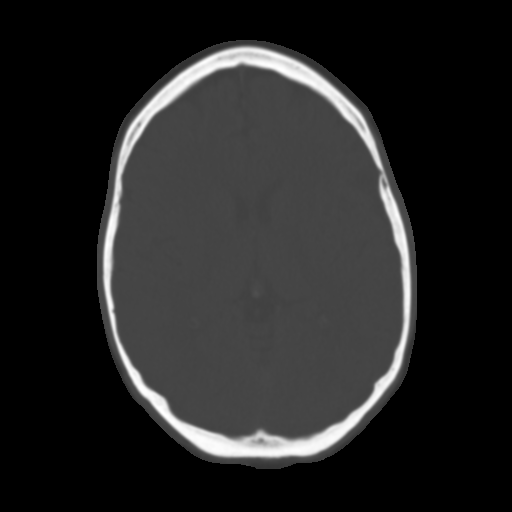
[im 17/31  brain]
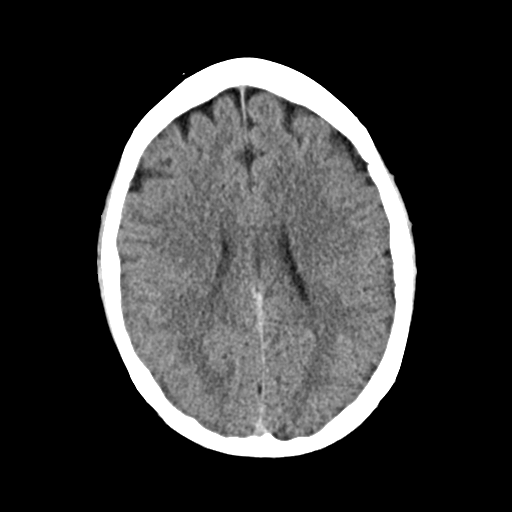
[im 20/31  brain]
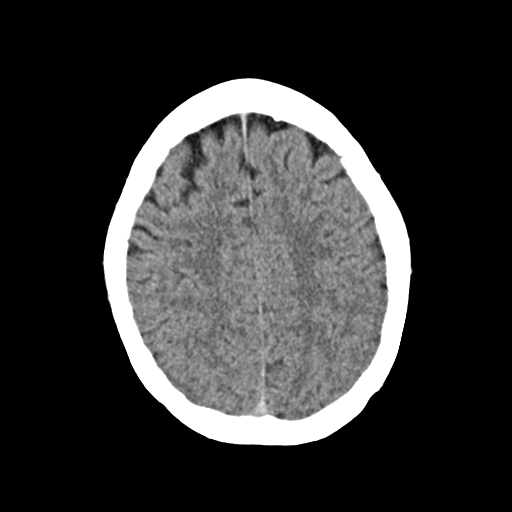
[im 23/31  brain]
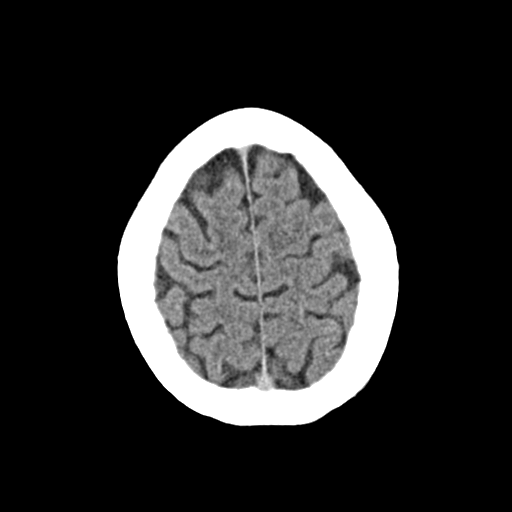
[im 25/31  brain]
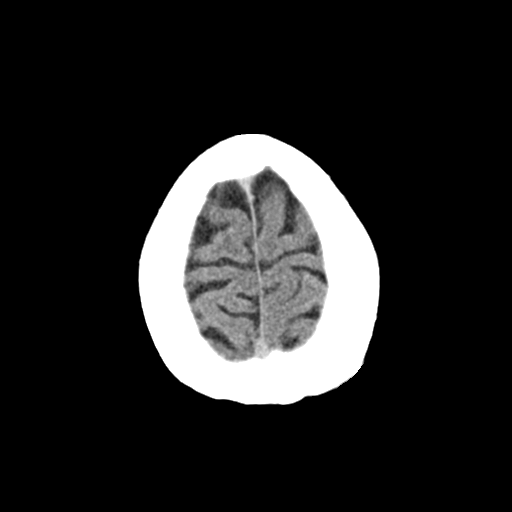
[im 25/31  bone]
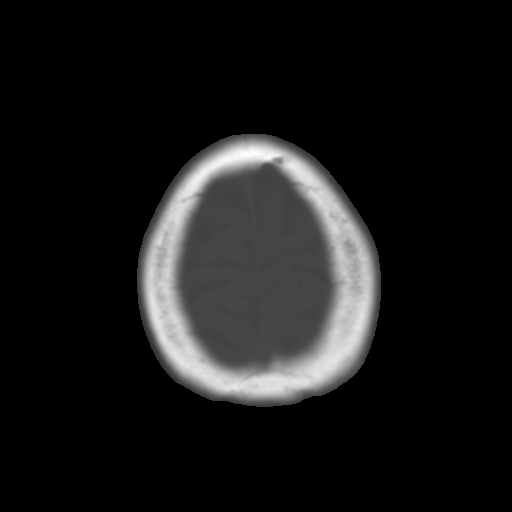
[im 28/31  brain]
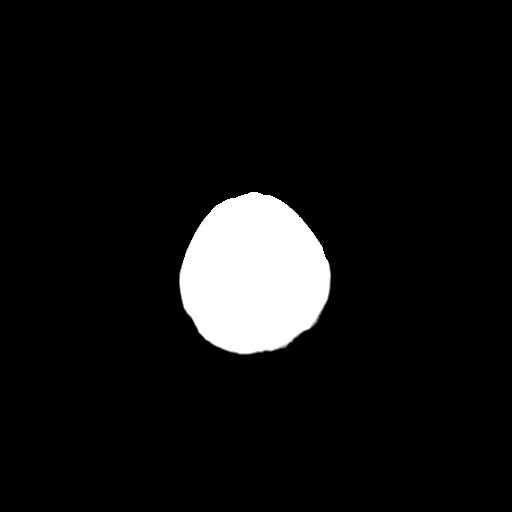

[Series 4: coronal soft · coronal · 0.32mm/px · 3 of 70 slices shown]
[im 24/70  brain]
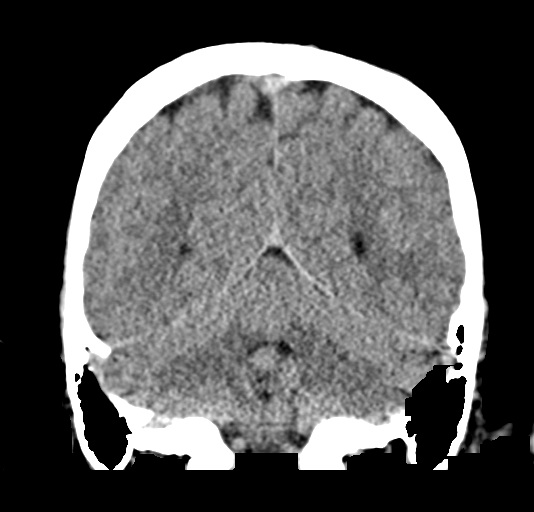
[im 31/70  brain]
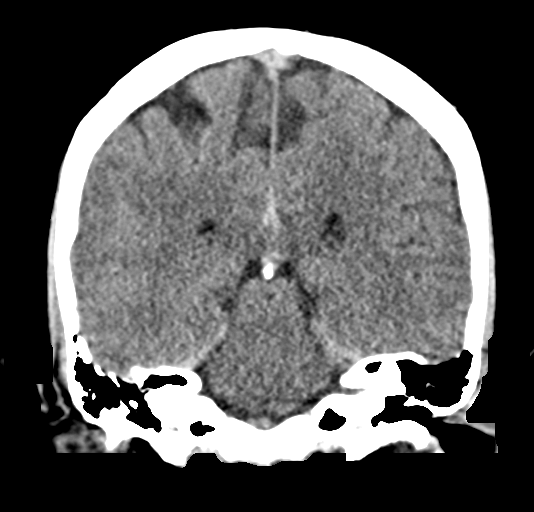
[im 39/70  brain]
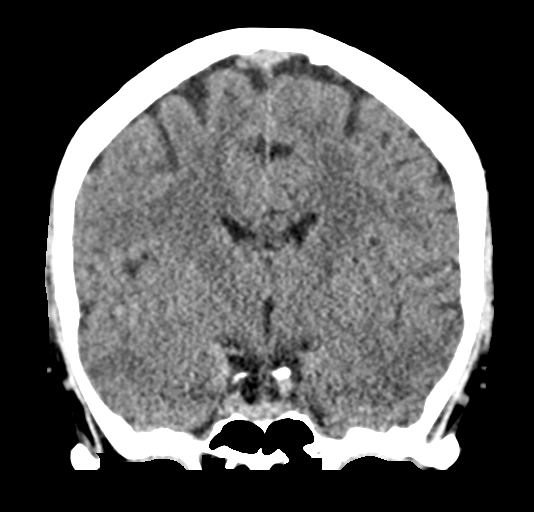

[Series 5: sag soft · sagittal · 0.31mm/px · 3 of 54 slices shown]
[im 18/54  brain]
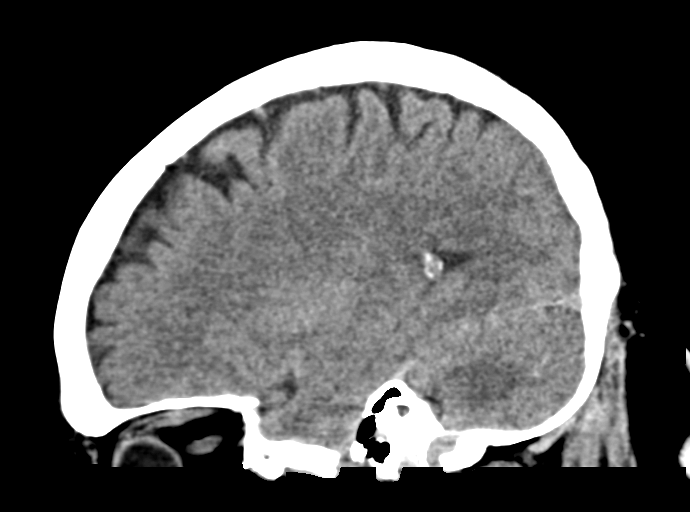
[im 27/54  brain]
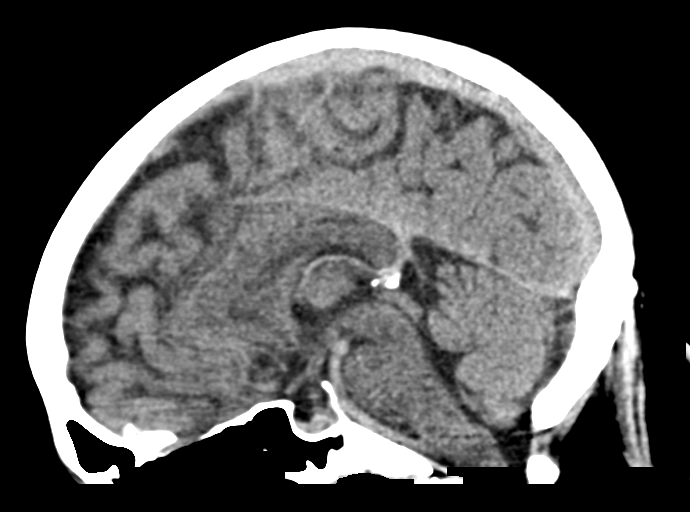
[im 36/54  brain]
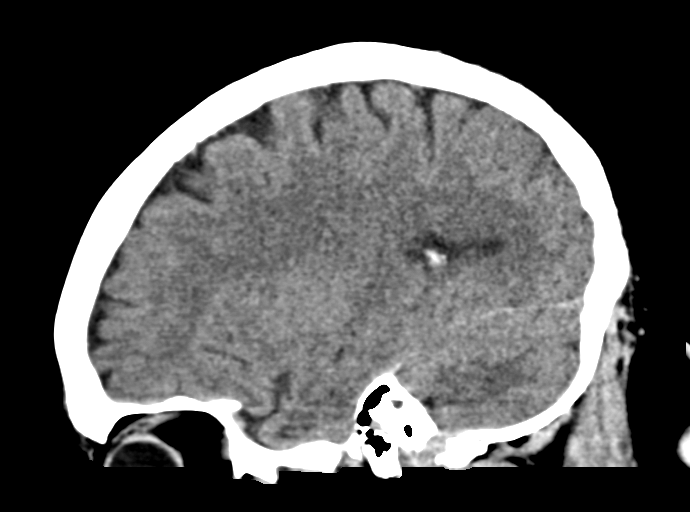

[16 of 47 positions shown; findings below may reference images not displayed]

FINDINGS: Brain: No evidence of acute infarction, hemorrhage, hydrocephalus,
extra-axial collection or mass lesion/mass effect.

Vascular: No hyperdense vessel or unexpected calcification.

Skull: Normal. Negative for fracture or focal lesion.

Sinuses/Orbits: No acute finding.

Other: None.
IMPRESSION: No acute intracranial abnormalities. Normal brain.

## 2021-07-28 MED ORDER — SODIUM CHLORIDE 0.9 % IV BOLUS
1000.0000 mL | Freq: Once | INTRAVENOUS | Status: AC
  Administered 2021-07-28: 1000 mL via INTRAVENOUS

## 2021-07-28 MED ORDER — DIPHENHYDRAMINE HCL 50 MG/ML IJ SOLN
12.5000 mg | Freq: Once | INTRAMUSCULAR | Status: AC
  Administered 2021-07-28: 12.5 mg via INTRAVENOUS
  Filled 2021-07-28: qty 1

## 2021-07-28 MED ORDER — ACETAMINOPHEN 325 MG PO TABS
650.0000 mg | ORAL_TABLET | Freq: Once | ORAL | Status: AC | PRN
  Administered 2021-07-28: 650 mg via ORAL
  Filled 2021-07-28: qty 2

## 2021-07-28 MED ORDER — TETRACAINE HCL 0.5 % OP SOLN
2.0000 [drp] | Freq: Once | OPHTHALMIC | Status: DC
  Filled 2021-07-28: qty 4

## 2021-07-28 MED ORDER — PROCHLORPERAZINE EDISYLATE 10 MG/2ML IJ SOLN
10.0000 mg | Freq: Once | INTRAMUSCULAR | Status: AC
  Administered 2021-07-28: 10 mg via INTRAVENOUS
  Filled 2021-07-28: qty 2

## 2021-07-28 MED ORDER — GADOBUTROL 1 MMOL/ML IV SOLN
10.0000 mL | Freq: Once | INTRAVENOUS | Status: AC | PRN
  Administered 2021-07-28: 10 mL via INTRAVENOUS

## 2021-07-28 NOTE — ED Provider Notes (Signed)
Patient was sent here from [facility] after being seen by MD Pfeiffer.  Patient needed an MRI.  The plan was to consult neurology if MRIs were normal.  Physical Exam  BP 104/68   Pulse (!) 107   Temp 98.6 F (37 C) (Oral)   Resp 18   Wt 122.5 kg   SpO2 100%   BMI 46.35 kg/m   Physical Exam Vitals and nursing note reviewed.  Constitutional:      Appearance: Normal appearance.  HENT:     Head: Normocephalic and atraumatic.  Eyes:     Pupils: Pupils are equal, round, and reactive to light.  Cardiovascular:     Rate and Rhythm: Regular rhythm. Tachycardia present.  Pulmonary:     Effort: Pulmonary effort is normal. No respiratory distress.  Skin:    General: Skin is warm and dry.     Findings: No rash.  Neurological:     Mental Status: She is alert.  Psychiatric:        Mood and Affect: Mood normal.    ED Course/Procedures    MRI is negative and neurologypaged.  Patient reporting migraine.  Also noted to be hypotensive. I will give patient migraine cocktail as well as bolus of fluid.  Patient denied headache after this treatment.  MDM   MD Wilford Corner suggested OP follow-up with Ophthalmology.  I discussed this with the patient and she is agreeable to the plan.  She will have somebody pick her up and take her back to her car at [facility].       Saddie Benders, PA-C 07/28/21 1757    Franne Forts, DO 07/30/21 1205

## 2021-07-28 NOTE — ED Notes (Signed)
[facility] at bedside 

## 2021-07-28 NOTE — ED Provider Notes (Signed)
[facility] EMERGENCY DEPARTMENT Provider Note   CSN: [id number] Arrival date & time: 07/28/21  9678     History Chief Complaint  Patient presents with   Eye Problem    Debbie Barrett is a 55 y.o. female.  HPI Patient was about a week ago she started getting difficulty with double vision and the movement of her left eye.  She reports that the lid started to droop and her vision became doubled.  Is better if she keeps the left eye closed.  Individually, each eye has normal vision.  She does not perceive loss of vision.  She reports due to the double vision it does make her feel somewhat dizzy but she is not off balance.  She reports she is starting to feel like there is some pressure in the left eye but not specifically painful.  The right eye feels normal.  She denies any difficulty swallowing.  No perception of numbness or tingling of the face.  No weakness numbness tingling of the extremities.  No difficulty with walking or gait.  No associated injury.  This occurred spontaneously.  Patient is diabetic.  She reports somewhere either little more less than a year ago she had a somewhat similar problem.  She was seen by a diabetic retinal specialist and got retinal injections.  She reports that she was instructed to see the neurologist by the retinal specialist for the double vision and the difficulty moving her eye, however her appointment for neurologist was so far out that by the time she was seen, she reports that the symptoms were resolved and no further evaluation was undertaken at that time.  Patient reports she has some restless legs but has not been having any paresthesias or numbness.  She reports she is otherwise doing well.  She has not been having problems with fevers chills headaches visual problems before a week and a half ago when the swelling started.    Past Medical History:  Diagnosis Date   Diabetes mellitus without complication (HCC)    Headache     There  are no problems to display for this patient.   Past Surgical History:  Procedure Laterality Date   CARPAL TUNNEL RELEASE     CESAREAN SECTION       OB History   No obstetric history on file.     No family history on file.  Social History   Tobacco Use   Smoking status: Never   Smokeless tobacco: Never  Vaping Use   Vaping Use: Some days  Substance Use Topics   Alcohol use: Yes    Comment: occasional   Drug use: No    Home Medications Prior to Admission medications   Medication Sig Start Date End Date Taking? Authorizing Provider  clindamycin (CLEOCIN) 150 MG capsule Take 2 capsules (300 mg total) by mouth every 6 (six) hours. 12/20/18   Renne Crigler, PA-C  insulin aspart (NOVOLOG) 100 UNIT/ML injection Sliding scale insulin 3 times a day with meals 01/18/18   Ward, Baxter Hire N, DO  insulin glargine (LANTUS) 100 UNIT/ML injection Inject 0.4 mLs (40 Units total) into the skin at bedtime. 01/18/18   Ward, Layla Maw, DO    Allergies    Ketorolac, Penicillins, Sulfa antibiotics, and Tramadol  Review of Systems   Review of Systems 10 systems reviewed and negative except as per HPI Physical Exam Updated Vital Signs BP 140/86   Pulse (!) 110   Temp 98.6 F (37 C) (Oral)  Resp 18   Wt 122.5 kg   SpO2 98%   BMI 46.35 kg/m   Physical Exam Constitutional:      Comments: Alert nontoxic mental status clear.  HENT:     Head: Normocephalic and atraumatic.     Nose: Nose normal.     Mouth/Throat:     Mouth: Mucous membranes are moist.     Pharynx: Oropharynx is clear.  Eyes:     Comments: At rest patient has a pronounced left ptosis.  Patient able to open the lid with effort.  Extraocular motions are disconjugate.  Right eye has brisk and coordinated movements.  Left eye seems to leg particularly with medial gaze.  Pupils are symmetric about 3 mm and responsive to light.  No significant scleral injection or soft tissue swellings.  Cardiovascular:     Rate and Rhythm:  Normal rate and regular rhythm.  Pulmonary:     Effort: Pulmonary effort is normal.     Breath sounds: Normal breath sounds.  Abdominal:     General: There is no distension.     Palpations: Abdomen is soft.     Tenderness: There is no abdominal tenderness. There is no guarding.  Musculoskeletal:        General: No swelling. Normal range of motion.     Cervical back: Neck supple.  Skin:    General: Skin is warm and dry.  Neurological:     General: No focal deficit present.     Mental Status: She is oriented to person, place, and time.     Cranial Nerves: Cranial nerve deficit present.     Sensory: No sensory deficit.     Motor: No weakness.     Coordination: Coordination normal.     Gait: Gait normal.     Comments: Cranial nerve III deficit with left ptosis and left eye medial gaze sluggishness.  Otherwise facial movements are normal and intact.  No droop or facial asymmetry aside from the ocular exam.  All movements coordinated purposeful symmetric.  Speech is clear.  Normal content.  No expressive or receptive aphasia.  Psychiatric:        Mood and Affect: Mood normal.    ED Results / Procedures / Treatments   Labs (all labs ordered are listed, but only abnormal results are displayed) Labs Reviewed  CBC - Abnormal; Notable for the following components:      Result Value   WBC 13.6 (*)    RBC 5.51 (*)    Hemoglobin 16.4 (*)    HCT 49.6 (*)    All other components within normal limits  DIFFERENTIAL - Abnormal; Notable for the following components:   Neutro Abs 9.8 (*)    Abs Immature Granulocytes 0.12 (*)    All other components within normal limits  COMPREHENSIVE METABOLIC PANEL - Abnormal; Notable for the following components:   Glucose, Bld 188 (*)    Total Protein 8.2 (*)    AST 14 (*)    All other components within normal limits  URINALYSIS, ROUTINE W REFLEX MICROSCOPIC - Abnormal; Notable for the following components:   Color, Urine STRAW (*)    APPearance HAZY  (*)    Glucose, UA >=500 (*)    Ketones, ur 80 (*)    All other components within normal limits  URINALYSIS, MICROSCOPIC (REFLEX) - Abnormal; Notable for the following components:   Bacteria, UA MANY (*)    All other components within normal limits  RESP PANEL BY RT-PCR (FLU  A&B, COVID) ARPGX2  PROTIME-INR  APTT  RAPID URINE DRUG SCREEN, HOSP PERFORMED  SEDIMENTATION RATE  TSH  HEMOGLOBIN A1C    EKG EKG Interpretation  Date/Time:  Friday July 28 2021 09:01:18 EDT Ventricular Rate:  100 PR Interval:  142 QRS Duration: 100 QT Interval:  353 QTC Calculation: 456 R Axis:   45 Text Interpretation: Sinus tachycardia no change from previous Confirmed by Arby Barrette [phone]) on 07/28/2021 12:44:33 PM  Radiology CT HEAD WO CONTRAST  Result Date: 07/28/2021 CLINICAL DATA:  Neuro deficit. Stroke suspected. Left eye double vision for 1 week. EXAM: CT HEAD WITHOUT CONTRAST TECHNIQUE: Contiguous axial images were obtained from the base of the skull through the vertex without intravenous contrast. COMPARISON:  Brain MRI 06/09/2019 FINDINGS: Brain: No evidence of acute infarction, hemorrhage, hydrocephalus, extra-axial collection or mass lesion/mass effect. Vascular: No hyperdense vessel or unexpected calcification. Skull: Normal. Negative for fracture or focal lesion. Sinuses/Orbits: No acute finding. Other: None. IMPRESSION: No acute intracranial abnormalities. Normal brain. Electronically Signed   By: Signa Kell M.D.   On: 07/28/2021 08:56    Procedures Procedures   Medications Ordered in ED Medications  tetracaine (PONTOCAINE) 0.5 % ophthalmic solution 2 drop (2 drops Both Eyes Not Given 07/28/21 0849)  acetaminophen (TYLENOL) tablet 650 mg (650 mg Oral Given 07/28/21 1135)    ED Course  I have reviewed the triage vital signs and the nursing notes.  Pertinent labs & imaging results that were available during my care of the patient were reviewed by me and considered in my  medical decision making (see chart for details).    MDM Rules/Calculators/A&P                          Consult: Reviewed with Dr. Otelia Limes.  At this time recommendation is for MRI with and without contrast, MRA and MRI orbits with and without contrast.  After completion of studies, neurology consult likely indicated unless alternative etiology identified.  Patient presents outlined above she has had greater than a week of ocular symptoms which include ptosis and disconjugate gaze with apparent cranial nerves III ophthalmoplegia.  At this time we will need to proceed with rule out for stroke or other intraorbital syndrome or intracranial mass.  Patient is otherwise well.  She does not have any other neurologic symptoms or dysfunction.  She does have history of diabetes and a similar episode that resolved spontaneously a year ago.  Without any bilateral symptoms or dysphagia, I have much lower suspicion for myasthenia gravis.  I will disposition pending MRI results and neurology consultation.  Final Clinical Impression(s) / ED Diagnoses Final diagnoses:  Ophthalmoplegia of left eye    Rx / DC Orders ED Discharge Orders     None        Arby Barrette, MD 07/28/21 1248

## 2021-07-28 NOTE — ED Notes (Signed)
Pt returned from MRI °

## 2021-07-28 NOTE — ED Triage Notes (Signed)
Pt states she has been having problems with her left eye and keeping it open, history of diabetic retinopathology. States she can see fine out of her eye if she can open it but it will not stay open. C/o headache.

## 2021-07-28 NOTE — Discharge Instructions (Signed)
Please call the ophthalmologist on your discharge papers to obtain an appointment with them.  They will follow-up on your symptoms.  Continue to use your insulin as prescribed and keep a close watch on your blood sugars.

## 2021-07-28 NOTE — ED Notes (Signed)
Pt transferred from [facility] via [facility], pt c.o migraine at this time. Pt a.o, ambulatory. Tylenol given
